# Patient Record
Sex: Male | Born: 1990 | Race: Black or African American | Hispanic: No | Marital: Single | Smoking: Current every day smoker
Health system: Southern US, Community
[De-identification: ages and names within clinical notes are randomized; demographics above are authoritative.]

## PROBLEM LIST (undated history)

## (undated) DIAGNOSIS — S91302A Unspecified open wound, left foot, initial encounter: Secondary | ICD-10-CM

---
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
## 2017-01-08 ENCOUNTER — Emergency Department (HOSPITAL_COMMUNITY): Payer: Worker's Compensation

## 2017-01-08 ENCOUNTER — Encounter (HOSPITAL_COMMUNITY): Payer: Self-pay | Admitting: *Deleted

## 2017-01-08 ENCOUNTER — Emergency Department (HOSPITAL_COMMUNITY): Payer: Worker's Compensation | Admitting: Anesthesiology

## 2017-01-08 ENCOUNTER — Inpatient Hospital Stay (HOSPITAL_COMMUNITY): Payer: Worker's Compensation

## 2017-01-08 ENCOUNTER — Inpatient Hospital Stay (HOSPITAL_COMMUNITY)
Admission: EM | Admit: 2017-01-08 | Discharge: 2017-01-11 | DRG: 908 | Disposition: A | Discharge status: Home Health | Payer: Worker's Compensation | Attending: Orthopedic Surgery | Admitting: Orthopedic Surgery

## 2017-01-08 ENCOUNTER — Encounter (HOSPITAL_COMMUNITY)
Admission: EM | Disposition: A | Discharge status: Home Health | Payer: Self-pay | Source: Home / Self Care | Attending: Orthopedic Surgery

## 2017-01-08 DIAGNOSIS — S92902B Unspecified fracture of left foot, initial encounter for open fracture: Secondary | ICD-10-CM

## 2017-01-08 DIAGNOSIS — S8262XA Displaced fracture of lateral malleolus of left fibula, initial encounter for closed fracture: Secondary | ICD-10-CM | POA: Diagnosis present

## 2017-01-08 DIAGNOSIS — S9782XA Crushing injury of left foot, initial encounter: Principal | ICD-10-CM | POA: Diagnosis present

## 2017-01-08 DIAGNOSIS — M79672 Pain in left foot: Secondary | ICD-10-CM | POA: Diagnosis present

## 2017-01-08 DIAGNOSIS — S92352B Displaced fracture of fifth metatarsal bone, left foot, initial encounter for open fracture: Secondary | ICD-10-CM | POA: Diagnosis present

## 2017-01-08 DIAGNOSIS — Y990 Civilian activity done for income or pay: Secondary | ICD-10-CM

## 2017-01-08 DIAGNOSIS — F17210 Nicotine dependence, cigarettes, uncomplicated: Secondary | ICD-10-CM | POA: Diagnosis present

## 2017-01-08 DIAGNOSIS — S92215B Nondisplaced fracture of cuboid bone of left foot, initial encounter for open fracture: Secondary | ICD-10-CM

## 2017-01-08 DIAGNOSIS — S92342B Displaced fracture of fourth metatarsal bone, left foot, initial encounter for open fracture: Secondary | ICD-10-CM | POA: Diagnosis present

## 2017-01-08 DIAGNOSIS — W230XXA Caught, crushed, jammed, or pinched between moving objects, initial encounter: Secondary | ICD-10-CM | POA: Diagnosis present

## 2017-01-08 DIAGNOSIS — S92332B Displaced fracture of third metatarsal bone, left foot, initial encounter for open fracture: Secondary | ICD-10-CM | POA: Diagnosis present

## 2017-01-08 DIAGNOSIS — S92322B Displaced fracture of second metatarsal bone, left foot, initial encounter for open fracture: Secondary | ICD-10-CM | POA: Diagnosis present

## 2017-01-08 DIAGNOSIS — T148XXA Other injury of unspecified body region, initial encounter: Secondary | ICD-10-CM

## 2017-01-08 HISTORY — PX: I & D EXTREMITY: SHX5045

## 2017-01-08 LAB — BASIC METABOLIC PANEL
ANION GAP: 6 (ref 5–15)
BUN: 16 mg/dL (ref 6–20)
CALCIUM: 8.7 mg/dL — AB (ref 8.9–10.3)
CO2: 25 mmol/L (ref 22–32)
Chloride: 110 mmol/L (ref 101–111)
Creatinine, Ser: 0.88 mg/dL (ref 0.61–1.24)
GFR calc Af Amer: >60 mL/min (ref >60)
GLUCOSE: 110 mg/dL — AB (ref 65–99)
Potassium: 4 mmol/L (ref 3.5–5.1)
SODIUM: 141 mmol/L (ref 135–145)

## 2017-01-08 LAB — CBC WITH DIFFERENTIAL/PLATELET
BASOS ABS: 0 10*3/uL (ref 0.0–0.1)
BASOS PCT: 0 %
Eosinophils Absolute: 0.1 10*3/uL (ref 0.0–0.7)
Eosinophils Relative: 1 %
HCT: 37 % — ABNORMAL LOW (ref 39.0–52.0)
Hemoglobin: 12.4 g/dL — ABNORMAL LOW (ref 13.0–17.0)
Lymphocytes Relative: 16 %
Lymphs Abs: 1.8 10*3/uL (ref 0.7–4.0)
MCH: 33.8 pg (ref 26.0–34.0)
MCHC: 33.5 g/dL (ref 30.0–36.0)
MCV: 100.8 fL — ABNORMAL HIGH (ref 78.0–100.0)
MONO ABS: 0.7 10*3/uL (ref 0.1–1.0)
Monocytes Relative: 6 %
NEUTROS ABS: 9.3 10*3/uL — AB (ref 1.7–7.7)
Neutrophils Relative %: 77 %
PLATELETS: 194 10*3/uL (ref 150–400)
RBC: 3.67 MIL/uL — ABNORMAL LOW (ref 4.22–5.81)
RDW: 12.4 % (ref 11.5–15.5)
WBC: 11.9 10*3/uL — ABNORMAL HIGH (ref 4.0–10.5)

## 2017-01-08 SURGERY — IRRIGATION AND DEBRIDEMENT EXTREMITY
Anesthesia: General | Site: Foot | Laterality: Left

## 2017-01-08 MED ORDER — SUGAMMADEX SODIUM 200 MG/2ML IV SOLN
INTRAVENOUS | Status: DC | PRN
  Administered 2017-01-08: 100 mg via INTRAVENOUS

## 2017-01-08 MED ORDER — ONDANSETRON HCL 4 MG/2ML IJ SOLN
INTRAMUSCULAR | Status: AC
  Filled 2017-01-08: qty 2

## 2017-01-08 MED ORDER — MORPHINE SULFATE (PF) 4 MG/ML IV SOLN
4.0000 mg | Freq: Once | INTRAVENOUS | Status: AC
  Administered 2017-01-08: 4 mg via INTRAVENOUS
  Filled 2017-01-08: qty 1

## 2017-01-08 MED ORDER — LACTATED RINGERS IV SOLN
INTRAVENOUS | Status: DC | PRN
  Administered 2017-01-08 (×2): via INTRAVENOUS

## 2017-01-08 MED ORDER — CEFAZOLIN SODIUM-DEXTROSE 2-4 GM/100ML-% IV SOLN
INTRAVENOUS | Status: AC
  Filled 2017-01-08: qty 100

## 2017-01-08 MED ORDER — MORPHINE SULFATE (PF) 2 MG/ML IV SOLN
2.0000 mg | INTRAVENOUS | Status: DC | PRN
  Administered 2017-01-09 – 2017-01-10 (×3): 2 mg via INTRAVENOUS
  Filled 2017-01-08 (×3): qty 1

## 2017-01-08 MED ORDER — PROPOFOL 10 MG/ML IV BOLUS
INTRAVENOUS | Status: AC
  Filled 2017-01-08: qty 40

## 2017-01-08 MED ORDER — LIDOCAINE 2% (20 MG/ML) 5 ML SYRINGE
INTRAMUSCULAR | Status: AC
  Filled 2017-01-08: qty 5

## 2017-01-08 MED ORDER — KETOROLAC TROMETHAMINE 15 MG/ML IJ SOLN
INTRAMUSCULAR | Status: AC
  Filled 2017-01-08: qty 1

## 2017-01-08 MED ORDER — LIDOCAINE HCL (CARDIAC) 20 MG/ML IV SOLN
INTRAVENOUS | Status: DC | PRN
  Administered 2017-01-08: 50 mg via INTRAVENOUS

## 2017-01-08 MED ORDER — FENTANYL CITRATE (PF) 100 MCG/2ML IJ SOLN
INTRAMUSCULAR | Status: DC | PRN
  Administered 2017-01-08 (×4): 50 ug via INTRAVENOUS

## 2017-01-08 MED ORDER — CEFAZOLIN SODIUM-DEXTROSE 1-4 GM/50ML-% IV SOLN
1.0000 g | Freq: Four times a day (QID) | INTRAVENOUS | Status: AC
  Administered 2017-01-08 – 2017-01-09 (×3): 1 g via INTRAVENOUS
  Filled 2017-01-08 (×3): qty 50

## 2017-01-08 MED ORDER — METHOCARBAMOL 1000 MG/10ML IJ SOLN
500.0000 mg | Freq: Four times a day (QID) | INTRAMUSCULAR | Status: DC | PRN
  Administered 2017-01-08: 500 mg via INTRAVENOUS
  Filled 2017-01-08: qty 550

## 2017-01-08 MED ORDER — ONDANSETRON HCL 4 MG/2ML IJ SOLN
INTRAMUSCULAR | Status: DC | PRN
  Administered 2017-01-08: 4 mg via INTRAVENOUS

## 2017-01-08 MED ORDER — METOCLOPRAMIDE HCL 5 MG/ML IJ SOLN: 5.0000 mg | Freq: Three times a day (TID) | INTRAMUSCULAR | Status: DC | PRN

## 2017-01-08 MED ORDER — MIDAZOLAM HCL 2 MG/2ML IJ SOLN
INTRAMUSCULAR | Status: AC
  Filled 2017-01-08: qty 2

## 2017-01-08 MED ORDER — METOCLOPRAMIDE HCL 5 MG PO TABS: 5.0000 mg | ORAL_TABLET | Freq: Three times a day (TID) | ORAL | Status: DC | PRN

## 2017-01-08 MED ORDER — HYDROMORPHONE HCL 1 MG/ML IJ SOLN: 0.2500 mg | INTRAMUSCULAR | Status: DC | PRN

## 2017-01-08 MED ORDER — SUCCINYLCHOLINE CHLORIDE 200 MG/10ML IV SOSY
PREFILLED_SYRINGE | INTRAVENOUS | Status: AC
  Filled 2017-01-08: qty 10

## 2017-01-08 MED ORDER — CEFAZOLIN SODIUM-DEXTROSE 2-3 GM-% IV SOLR
INTRAVENOUS | Status: DC | PRN
  Administered 2017-01-08: 2 g via INTRAVENOUS

## 2017-01-08 MED ORDER — SUGAMMADEX SODIUM 200 MG/2ML IV SOLN
INTRAVENOUS | Status: AC
  Filled 2017-01-08: qty 2

## 2017-01-08 MED ORDER — PROPOFOL 10 MG/ML IV BOLUS
INTRAVENOUS | Status: DC | PRN
  Administered 2017-01-08: 170 mg via INTRAVENOUS

## 2017-01-08 MED ORDER — FLEET ENEMA 7-19 GM/118ML RE ENEM: 1.0000 | ENEMA | Freq: Once | RECTAL | Status: DC | PRN

## 2017-01-08 MED ORDER — LACTATED RINGERS IV SOLN
INTRAVENOUS | Status: DC
  Administered 2017-01-08 – 2017-01-09 (×3): via INTRAVENOUS

## 2017-01-08 MED ORDER — ONDANSETRON HCL 4 MG PO TABS: 4.0000 mg | ORAL_TABLET | Freq: Four times a day (QID) | ORAL | Status: DC | PRN

## 2017-01-08 MED ORDER — CEFAZOLIN SODIUM-DEXTROSE 1-4 GM/50ML-% IV SOLN
1.0000 g | Freq: Once | INTRAVENOUS | Status: AC
  Administered 2017-01-08: 1 g via INTRAVENOUS
  Filled 2017-01-08: qty 50

## 2017-01-08 MED ORDER — DEXAMETHASONE SODIUM PHOSPHATE 10 MG/ML IJ SOLN
INTRAMUSCULAR | Status: DC | PRN
  Administered 2017-01-08: 4 mg via INTRAVENOUS

## 2017-01-08 MED ORDER — TETANUS-DIPHTH-ACELL PERTUSSIS 5-2.5-18.5 LF-MCG/0.5 IM SUSP
0.5000 mL | Freq: Once | INTRAMUSCULAR | Status: AC
  Administered 2017-01-08: 0.5 mL via INTRAMUSCULAR
  Filled 2017-01-08: qty 0.5

## 2017-01-08 MED ORDER — FENTANYL CITRATE (PF) 250 MCG/5ML IJ SOLN
INTRAMUSCULAR | Status: AC
  Filled 2017-01-08: qty 5

## 2017-01-08 MED ORDER — MIDAZOLAM HCL 5 MG/5ML IJ SOLN
INTRAMUSCULAR | Status: DC | PRN
  Administered 2017-01-08: 1 mg via INTRAVENOUS

## 2017-01-08 MED ORDER — ONDANSETRON HCL 4 MG/2ML IJ SOLN: 4.0000 mg | Freq: Four times a day (QID) | INTRAMUSCULAR | Status: DC | PRN

## 2017-01-08 MED ORDER — CEFAZOLIN SODIUM-DEXTROSE 2-4 GM/100ML-% IV SOLN: 2.0000 g | Freq: Once | INTRAVENOUS | Status: DC

## 2017-01-08 MED ORDER — ACETAMINOPHEN 650 MG RE SUPP: 650.0000 mg | Freq: Four times a day (QID) | RECTAL | Status: DC | PRN

## 2017-01-08 MED ORDER — METHOCARBAMOL 500 MG PO TABS
500.0000 mg | ORAL_TABLET | Freq: Four times a day (QID) | ORAL | Status: DC | PRN
  Administered 2017-01-10: 500 mg via ORAL
  Filled 2017-01-08: qty 1

## 2017-01-08 MED ORDER — SODIUM CHLORIDE 0.9 % IR SOLN
Status: DC | PRN
  Administered 2017-01-08: 6000 mL

## 2017-01-08 MED ORDER — KETOROLAC TROMETHAMINE 15 MG/ML IJ SOLN
15.0000 mg | Freq: Four times a day (QID) | INTRAMUSCULAR | Status: AC
  Administered 2017-01-08 – 2017-01-09 (×4): 15 mg via INTRAVENOUS
  Filled 2017-01-08 (×3): qty 1

## 2017-01-08 MED ORDER — DEXAMETHASONE SODIUM PHOSPHATE 10 MG/ML IJ SOLN
INTRAMUSCULAR | Status: AC
  Filled 2017-01-08: qty 1

## 2017-01-08 MED ORDER — ROCURONIUM BROMIDE 100 MG/10ML IV SOLN
INTRAVENOUS | Status: DC | PRN
  Administered 2017-01-08: 20 mg via INTRAVENOUS

## 2017-01-08 MED ORDER — SUCCINYLCHOLINE CHLORIDE 20 MG/ML IJ SOLN
INTRAMUSCULAR | Status: DC | PRN
  Administered 2017-01-08: 100 mg via INTRAVENOUS

## 2017-01-08 MED ORDER — PROMETHAZINE HCL 25 MG/ML IJ SOLN: 6.2500 mg | INTRAMUSCULAR | Status: DC | PRN

## 2017-01-08 MED ORDER — MAGNESIUM HYDROXIDE 400 MG/5ML PO SUSP: 30.0000 mL | Freq: Every day | ORAL | Status: DC | PRN

## 2017-01-08 MED ORDER — ROCURONIUM BROMIDE 50 MG/5ML IV SOSY
PREFILLED_SYRINGE | INTRAVENOUS | Status: AC
  Filled 2017-01-08: qty 5

## 2017-01-08 MED ORDER — OXYCODONE HCL 5 MG PO TABS
5.0000 mg | ORAL_TABLET | ORAL | Status: DC | PRN
  Administered 2017-01-09: 10 mg via ORAL
  Administered 2017-01-10 (×3): 5 mg via ORAL
  Administered 2017-01-11 (×2): 10 mg via ORAL
  Filled 2017-01-08 (×2): qty 2
  Filled 2017-01-08 (×2): qty 1
  Filled 2017-01-08: qty 2
  Filled 2017-01-08 (×2): qty 1

## 2017-01-08 MED ORDER — ACETAMINOPHEN 325 MG PO TABS
650.0000 mg | ORAL_TABLET | Freq: Four times a day (QID) | ORAL | Status: DC | PRN
  Administered 2017-01-10: 650 mg via ORAL
  Filled 2017-01-08: qty 2

## 2017-01-08 SURGICAL SUPPLY — 43 items
BAG SPEC THK2 15X12 ZIP CLS (MISCELLANEOUS)
BAG ZIPLOCK 12X15 (MISCELLANEOUS) ×1 IMPLANT
BANDAGE ESMARK 6X9 LF (GAUZE/BANDAGES/DRESSINGS) ×1 IMPLANT
BNDG CMPR 9X6 STRL LF SNTH (GAUZE/BANDAGES/DRESSINGS)
BNDG ESMARK 6X9 LF (GAUZE/BANDAGES/DRESSINGS)
BNDG GAUZE ELAST 4 BULKY (GAUZE/BANDAGES/DRESSINGS) ×1 IMPLANT
COVER SURGICAL LIGHT HANDLE (MISCELLANEOUS) ×3 IMPLANT
CUFF TOURN SGL QUICK 18 (TOURNIQUET CUFF) IMPLANT
CUFF TOURN SGL QUICK 24 (TOURNIQUET CUFF)
CUFF TOURN SGL QUICK 34 (TOURNIQUET CUFF)
CUFF TRNQT CYL 24X4X40X1 (TOURNIQUET CUFF) IMPLANT
CUFF TRNQT CYL 34X4X40X1 (TOURNIQUET CUFF) IMPLANT
DRAIN PENROSE 18X1/2 LTX STRL (DRAIN) ×1 IMPLANT
DRAPE INCISE IOBAN 66X45 STRL (DRAPES) ×2 IMPLANT
DRAPE OEC MINIVIEW 54X84 (DRAPES) ×2 IMPLANT
DRSG PAD ABDOMINAL 8X10 ST (GAUZE/BANDAGES/DRESSINGS) ×4 IMPLANT
DRSG VAC ATS MED SENSATRAC (GAUZE/BANDAGES/DRESSINGS) ×2 IMPLANT
DRSG VAC ATS SM SENSATRAC (GAUZE/BANDAGES/DRESSINGS) ×2 IMPLANT
DURAPREP 26ML APPLICATOR (WOUND CARE) ×1 IMPLANT
ELECT REM PT RETURN 15FT ADLT (MISCELLANEOUS) ×3 IMPLANT
GAUZE SPONGE 4X4 12PLY STRL (GAUZE/BANDAGES/DRESSINGS) ×3 IMPLANT
GAUZE XEROFORM 5X9 LF (GAUZE/BANDAGES/DRESSINGS) ×2 IMPLANT
GLOVE BIO SURGEON STRL SZ7 (GLOVE) ×1 IMPLANT
GLOVE ORTHO TXT STRL SZ7.5 (GLOVE) ×1 IMPLANT
GLOVE SURG ORTHO 8.5 STRL (GLOVE) ×2 IMPLANT
GLOVE SURG SS PI 8.0 STRL IVOR (GLOVE) ×2 IMPLANT
GOWN STRL REUS W/TWL XL LVL3 (GOWN DISPOSABLE) ×1 IMPLANT
HANDPIECE INTERPULSE COAX TIP (DISPOSABLE) ×3
KIT BASIN OR (CUSTOM PROCEDURE TRAY) ×3 IMPLANT
MANIFOLD NEPTUNE II (INSTRUMENTS) ×3 IMPLANT
PACK ORTHO EXTREMITY (CUSTOM PROCEDURE TRAY) ×3 IMPLANT
PAD CAST 4YDX4 CTTN HI CHSV (CAST SUPPLIES) ×1 IMPLANT
PADDING CAST COTTON 4X4 STRL (CAST SUPPLIES)
POSITIONER SURGICAL ARM (MISCELLANEOUS) ×3 IMPLANT
SET HNDPC FAN SPRY TIP SCT (DISPOSABLE) ×1 IMPLANT
SPLINT FIBERGLASS 3X35 (CAST SUPPLIES) ×2 IMPLANT
SPLINT FIBERGLASS 4X15 (CAST SUPPLIES) ×2 IMPLANT
SUT BONE WAX W31G (SUTURE) ×1 IMPLANT
SWAB COLLECTION DEVICE MRSA (MISCELLANEOUS) ×1 IMPLANT
SWAB CULTURE ESWAB REG 1ML (MISCELLANEOUS) ×1 IMPLANT
SYR CONTROL 10ML LL (SYRINGE) ×1 IMPLANT
TOWEL OR 17X26 10 PK STRL BLUE (TOWEL DISPOSABLE) ×6 IMPLANT
TRAY PREP A LATEX SAFE STRL (SET/KITS/TRAYS/PACK) ×2 IMPLANT

## 2017-01-08 NOTE — ED Notes (Signed)
ED Provider at bedside. 

## 2017-01-08 NOTE — H&P (Signed)
No primary care provider on file. Chief Complaint: Left foot crush injury at work. History: 26 year old otherwise he105althy gentleman who was at work. Unfortunately had his left foot run over by a forklift. He noted immediate pain and inability to ambulate and significant lacerations. Brought to the emergency room for further workup and treatment. No other significant medical issues. Otherwise healthy prior to this trauma.   No Known Allergies  No current facility-administered medications on file prior to encounter.    No current outpatient prescriptions on file prior to encounter.    Physical Exam: Vitals:   01/08/17 1032 01/08/17 1033  BP: (!) 154/94   Pulse: 73   Resp: 18   Temp:  97.7 F (36.5 C)  Alert and oriented 3.  No shortness of breath, chest pain. Heart regular rate and rhythm.  Abdomen soft and nontender. No rebound tenderness.  Upper extremity full range of motion, no gross formerly, or pain with range of motion of the shoulder, elbow, wrist.  Lower extremity. Left foot. 3 lacerations over the lateral and anterior lateral surface of the foot. Moderate bleeding (venous) is noted. No gross contamination is noted. There is obvious deformity over the dorsum of the foot and significant swelling in the mid and forefoot region.  Compartments of the calf are soft and nontender no pain with palpation. No hip or knee pain with palpation and range of motion.  Sensation in the foot to light touch is intact. He has pain but he is able to move the toes. Capillary refill is less than 2 seconds. Foot is warm to touch.  Image: Dg Ankle Complete Left  Result Date: 01/08/2017 CLINICAL DATA:  Multiple severe rib fractures.  Crush injury. EXAM: LEFT ANKLE COMPLETE - 3+ VIEW COMPARISON:  None. FINDINGS: Horizontal fracture through the lateral malleolus. The ankle mortise intact. Talar dome appears normal. Multiple severe tarsal and metatarsal fractures described on foot film. IMPRESSION:  1. Horizontal fracture of the lateral malleolus. 2. Ankle mortise intact. 3. Severe comminuted fractures of the tarsal row and proximal metatarsal row. Recommend CT ankle and foot. Electronically Signed   By: Genevive BiStewart  Edmunds M.D.   On: 01/08/2017 11:26   Dg Foot Complete Left  Result Date: 01/08/2017 CLINICAL DATA:  Pt c/o pain to obv deformity, open wounds to foot/ankle s/p crush inj today, extremity ran over by a forklift at his job. EXAM: LEFT FOOT - COMPLETE 3+ VIEW COMPARISON:  None FINDINGS: There is fractures of the proximal aspect of the second third fourth and fifth metatarsals. Severe comminution of the base of the fifth metatarsal. The fifth metatarsal is dislocated at the metatarsal-phalangeal joint. The metatarsal tarsal joint is poorly evaluated and likely disrupted. On lateral projection the cuboid bone is fractured. Calcaneus appears normal. IMPRESSION: 1. Comminuted fractures of the base of the second, third, fourth and fifth metatarsals. 2. Fifth metatarsal base severely comminuted. Fifth metatarsal phalangeal joint dislocation. 3. Fracture of the cuboid bone. 4. Disruption of the tarsal metatarsal joints. Concern for additional tarsal fractures. Recommend CT of the foot. Electronically Signed   By: Genevive BiStewart  Edmunds M.D.   On: 01/08/2017 11:24    A/P: This is a pleasant 26 year old gentleman who was unfortunately injured while at work this morning. Patient suffered a crush injury to the left foot with multiple fractures. In addition there is moderate swelling of the left foot.  Plan.  Formal I&D of the open fracture in the operating room.  Release of foot compartments.  Application of VAC dressing  and posterior splint.  We'll plan on admission to the hospital postoperative for IV antibiotics and elevation and observation. We will review x-rays with my partner Dr. Victorino Dike and defer definitive management to him. Discussed this with the patient, and his mother and all of their  questions were addressed. Reviewed all risks and benefits of surgery which include infection, death, stroke, paralysis, non-healing of the soft tissue, and bone. Need for additional surgery. Ongoing or worse pain. Permanent or partial disability, or impairment.

## 2017-01-08 NOTE — Transfer of Care (Signed)
Immediate Anesthesia Transfer of Care Note  Patient: John Kelley  Procedure(s) Performed: Procedure(s): IRRIGATION AND DEBRIDEMENT , closed reduction pinning of open foot fracture, application of wound vac. (Left)  Patient Location: PACU  Anesthesia Type:General  Level of Consciousness: awake, alert  and oriented  Airway & Oxygen Therapy: Patient Spontanous Breathing and Patient connected to face mask oxygen  Post-op Assessment: Report given to RN and Post -op Vital signs reviewed and stable  Post vital signs: Reviewed and stable  Last Vitals:  Vitals:   01/08/17 1033 01/08/17 1445  BP:  (!) 167/84  Pulse:    Resp:  16  Temp: 36.5 C 36.6 C    Last Pain:  Vitals:   01/08/17 1034  TempSrc:   PainSc: 8          Complications: No apparent anesthesia complications

## 2017-01-08 NOTE — Op Note (Signed)
Operative report.  Preoperative diagnosis. Open crush injury to the left foot. Metatarsal fractures 2, 3, 4, 5. Dislocation fifth metatarsal. Comminuted cuboid fracture. 2 large lacerations anterior lateral aspect of the heel. Puncture wound over the mid foot fifth metatarsal.  Postoperative diagnosis same  Operative procedure. 1. Irrigation and debridement of open left foot injury.  2. Closed reduction percutaneous pinning of multiple foot fractures.  3 application of wound VAC left foot.  4. Application of posterior splint   Complications none.  Indications. 26 year old male who unfortunately suffered a work-related injury this morning. Patient's left foot was crushed under a forklift resulting in multiple foot injuries. Brought to the emergency room for treatment. After discussing treatment options, we elected to proceed with the aforementioned procedure. All appropriate risks benefits and alternatives to surgery were discussed with the patient and his mother. Consent was obtained.  Operative report. Patient was brought to the operating room placed on the operating table. After successful induction of general anesthesia, and endotracheal intubation the left lower extremity was prepped and draped in a standard fashion. Timeout was taken to confirm the patient, procedure, and all other important pertinent data.  Using pulse lavage the lateral lacerations were I indeed. Using pulsatile lavage and sharp dissection I removed any grossly contaminated material and irrigated copiously with 3 L of normal saline. Once this was completed I then made 2 incisions between the first and second third and fourth metatarsals. I dissected down to the interosseous membrane bluntly and then dissected into this to release the compartments. With the compartments released I then palpated the displaced fifth metatarsal and reduced it. At this point with the foot reduced I then placed a K wire starting medially going  across and fixing the first second third and fourth metatarsals together I then placed a second pin on the lateral aspect of the fifth metatarsal and held temporarily reduced. A third K wire where prior was placed across the cuboid in order to maintain stability here. Intraoperative x-rays demonstrated the foot was in near anatomical position in the AP oblique and lateral views. At this point I was satisfied with the I&D in the temporary stabilization. In addition I had decompressed the compartments to prevent any compartment syndrome.  At this point in time a VAC was then secured to the patient as well as a posterior splint. The patient was then extubated transferred the PACU that incident. The end of the case all needle sponge counts were correct. There were no adverse intraoperative events.

## 2017-01-08 NOTE — ED Notes (Signed)
[facility] to [facility]

## 2017-01-08 NOTE — ED Notes (Signed)
Bed: [id number] Expected date:  Expected time:  Means of arrival:  Comments: 26 yo Foot run over by forklift

## 2017-01-08 NOTE — ED Triage Notes (Addendum)
Pt brought in by EMS for foot and ankle injury x 1 hr ago, ran over by forklift. PTA fent total bleeding noted to saturated drg

## 2017-01-08 NOTE — Anesthesia Postprocedure Evaluation (Signed)
Anesthesia Post Note  Patient: [place] L Corkins  Procedure(s) Performed: Procedure(s) (LRB): IRRIGATION AND DEBRIDEMENT , closed reduction pinning of open foot fracture, application of wound vac. (Left)     Patient location during evaluation: PACU Anesthesia Type: General Level of consciousness: awake and alert Pain management: pain level controlled Vital Signs Assessment: post-procedure vital signs reviewed and stable Respiratory status: spontaneous breathing, nonlabored ventilation, respiratory function stable and patient connected to nasal cannula oxygen Cardiovascular status: blood pressure returned to baseline and stable Postop Assessment: no signs of nausea or vomiting Anesthetic complications: no    Last Vitals:  Vitals:   01/08/17 1515 01/08/17 1545  BP: (!) 150/87   Pulse:    Resp:    Temp:  37 C    Last Pain:  Vitals:   01/08/17 1545  TempSrc:   PainSc: Asleep      LLE Sensation: Numbness (NUMBNESS CONT AT 5TH DIGIT LEFT FOOT) (01/08/17 1545)          Kennieth RadFitzgerald, Derrall Hicks E

## 2017-01-08 NOTE — Anesthesia Procedure Notes (Signed)
Procedure Name: Intubation Date/Time: 01/08/2017 1:09 PM Performed by: Sherian Maroon A Pre-anesthesia Checklist: Patient identified, Timeout performed, Patient being monitored, Suction available and Emergency Drugs available Patient Re-evaluated:Patient Re-evaluated prior to inductionOxygen Delivery Method: Circle system utilized Preoxygenation: Pre-oxygenation with 100% oxygen Intubation Type: IV induction Ventilation: Mask ventilation without difficulty Laryngoscope Size: Mac and 4 Grade View: Grade I Tube type: Oral Number of attempts: 1 Airway Equipment and Method: Stylet Placement Confirmation: ETT inserted through vocal cords under direct vision,  positive ETCO2 and breath sounds checked- equal and bilateral Secured at: 22 cm Tube secured with: Tape

## 2017-01-08 NOTE — ED Provider Notes (Signed)
[facility]-EMERGENCY DEPT Provider Note   CSN: [id number] Arrival date & time: 01/08/17  1023     History   Chief Complaint Chief Complaint  Patient presents with  . Foot Injury    HPI John Kelley is a 26 y.o. male.  John L Buehl is a 26 y.o. Male who presents to the emergency department with left foot and ankle injury while at work today. Patient reports he works at the [employer] center when a forklift backed over his left foot and ankle. He reports the forklift was resting on his foot and ankle for brief time until somebody was able to lift the forklift off of his foot and ankle. He reports pain to his left foot and ankle. EMS brought him to the emergency department. He's had 250 g of fentanyl prior to arrival. He denies any allergies. No anticoagulants. No daily medications. He denies other injuries. He does complain of some numbness to his left fifth toe.   The history is provided by the patient and medical records. No language interpreter was used.  Foot Injury      History reviewed. No pertinent past medical history.  There are no active problems to display for this patient.   History reviewed. No pertinent surgical history.     Home Medications    Prior to Admission medications   Not on File    Family History History reviewed. No pertinent family history.  Social History Social History  Substance Use Topics  . Smoking status: Current Every Day Smoker    Packs/day: 0.50    Types: Cigarettes  . Smokeless tobacco: Not on file  . Alcohol use No     Allergies   Patient has no known allergies.   Review of Systems Review of Systems  Constitutional: Negative for chills and fever.  HENT: Negative for congestion and sore throat.   Eyes: Negative for visual disturbance.  Respiratory: Negative for cough and shortness of breath.   Cardiovascular: Negative for chest pain.  Gastrointestinal: Negative for abdominal pain, nausea and  vomiting.  Genitourinary: Negative for dysuria.  Musculoskeletal: Positive for arthralgias. Negative for back pain and neck pain.  Skin: Positive for wound. Negative for rash.  Neurological: Negative for syncope, light-headedness and headaches.     Physical Exam Updated Vital Signs BP (!) 154/94   Pulse 73   Temp 97.7 F (36.5 C) (Axillary)   Resp 18   Ht 5\' 11"  (1.803 m)   Wt 61.2 kg (135 lb)   SpO2 100%   BMI 18.83 kg/m   Physical Exam  Constitutional: He is oriented to person, place, and time. He appears well-developed and well-nourished. No distress.  Nontoxic appearing.  HENT:  Head: Normocephalic and atraumatic.  Right Ear: External ear normal.  Left Ear: External ear normal.  Eyes: Conjunctivae are normal. Pupils are equal, round, and reactive to light. Right eye exhibits no discharge. Left eye exhibits no discharge.  Neck: Neck supple.  Cardiovascular: Normal rate, regular rhythm, normal heart sounds and intact distal pulses.   Bilateral dorsalis pedis and posterior tibialis pulses are intact.  Pulmonary/Chest: Effort normal and breath sounds normal. No respiratory distress.  Abdominal: Soft. There is no tenderness.  Musculoskeletal: He exhibits tenderness and deformity.  Deformity and several wounds noted to his left foot. Is a deformity noted to the dorsal aspect of his left foot. There are 2 large lacerations noted to the lateral aspect of his left foot. There is also a small puncture  wound noted to the dorsum of his left foot. He has good dorsalis pedis and posterior tibialis pulses. He reports decreased sensation to his left fifth toe. No left knee or hip tenderness to palpation.  Lymphadenopathy:    He has no cervical adenopathy.  Neurological: He is alert and oriented to person, place, and time. No sensory deficit. Coordination normal.  Sensation is intact in his bilateral toes except for his left fifth toe where he reports decreased sensation.  Skin: Skin is  warm and dry. Capillary refill takes less than 2 seconds. No rash noted. He is not diaphoretic. No erythema. No pallor.  Psychiatric: He has a normal mood and affect. His behavior is normal.  Nursing note and vitals reviewed.    ED Treatments / Results  Labs (all labs ordered are listed, but only abnormal results are displayed) Labs Reviewed  BASIC METABOLIC PANEL - Abnormal; Notable for the following:       Result Value   Glucose, Bld 110 (*)    Calcium 8.7 (*)    All other components within normal limits  CBC WITH DIFFERENTIAL/PLATELET - Abnormal; Notable for the following:    WBC 11.9 (*)    RBC 3.67 (*)    Hemoglobin 12.4 (*)    HCT 37.0 (*)    MCV 100.8 (*)    Neutro Abs 9.3 (*)    All other components within normal limits    EKG  EKG Interpretation None       Radiology Dg Ankle Complete Left  Result Date: 01/08/2017 CLINICAL DATA:  Multiple severe rib fractures.  Crush injury. EXAM: LEFT ANKLE COMPLETE - 3+ VIEW COMPARISON:  None. FINDINGS: Horizontal fracture through the lateral malleolus. The ankle mortise intact. Talar dome appears normal. Multiple severe tarsal and metatarsal fractures described on foot film. IMPRESSION: 1. Horizontal fracture of the lateral malleolus. 2. Ankle mortise intact. 3. Severe comminuted fractures of the tarsal row and proximal metatarsal row. Recommend CT ankle and foot. Electronically Signed   By: Genevive Bi M.D.   On: 01/08/2017 11:26   Ct Foot Left Wo Contrast  Result Date: 01/08/2017 CLINICAL DATA:  Crush injury of the left foot with multiple open fractures. The patient's foot was run over by a forklift. EXAM: CT OF THE LEFT FOOT WITHOUT CONTRAST TECHNIQUE: Multidetector CT imaging of the left foot was performed according to the standard protocol. Multiplanar CT image reconstructions were also generated. COMPARISON:  Radiographs dated 01/08/2017 FINDINGS: Bones/Joint/Cartilage Distal fibula: There is a transverse fracture through  the lateral malleolus without displacement. Talus: Small avulsions from the distal dorsal lateral aspect of the talus. Calcaneus: Small avulsion fractures lateral plantar aspect of the distal calcaneus at the calcaneocuboid joint. Navicular: There is a focal avulsion fracture of the medial plantar aspect of the navicular. Cuboid: There is a comminuted fracture of the lateral aspect of the cuboid with gas in the adjacent soft tissues as well as within the bone. Cuneiforms: There is disruption of the plantar ligaments at the cuneiforms. There small avulsions from the medial cuneiform with the severely comminuted fracture of the plantar aspect of middle and lateral cuneiforms. Extensive gas in the soft tissues. Metatarsals: Severely comminuted fracture of the proximal fifth metatarsal with spiral fracture extending throughout the shaft of the fifth metatarsal. There is dislocation at fifth MTP joint. There is a severely comminuted fracture of the base of second metatarsal. There are small avulsions from plantar aspect of the base of the third metatarsal. Fourth metatarsal appears  to be intact. First metatarsal is intact. Soft tissues There is extensive gas in the soft tissues of the foot particularly dorsally. There is gas in some of the bones including the cuboid, navicular, and talus There is an abnormal fluid collection at the dorsal aspect distal forefoot which probably represents a seroma, also containing air. IMPRESSION: Extensive fractures of left foot as described. Electronically Signed   By: Francene BoyersJames  Maxwell M.D.   On: 01/08/2017 12:56   Dg Foot Complete Left  Result Date: 01/08/2017 CLINICAL DATA:  Pt c/o pain to obv deformity, open wounds to foot/ankle s/p crush inj today, extremity ran over by a forklift at his job. EXAM: LEFT FOOT - COMPLETE 3+ VIEW COMPARISON:  None FINDINGS: There is fractures of the proximal aspect of the second third fourth and fifth metatarsals. Severe comminution of the base of the  fifth metatarsal. The fifth metatarsal is dislocated at the metatarsal-phalangeal joint. The metatarsal tarsal joint is poorly evaluated and likely disrupted. On lateral projection the cuboid bone is fractured. Calcaneus appears normal. IMPRESSION: 1. Comminuted fractures of the base of the second, third, fourth and fifth metatarsals. 2. Fifth metatarsal base severely comminuted. Fifth metatarsal phalangeal joint dislocation. 3. Fracture of the cuboid bone. 4. Disruption of the tarsal metatarsal joints. Concern for additional tarsal fractures. Recommend CT of the foot. Electronically Signed   By: Genevive BiStewart  Edmunds M.D.   On: 01/08/2017 11:24    Procedures Procedures (including critical care time)  Medications Ordered in ED Medications  ceFAZolin (ANCEF) 2-4 GM/100ML-% IVPB (not administered)  ceFAZolin (ANCEF) IVPB 2g/100 mL premix ( Intravenous MAR Hold 01/08/17 1301)  morphine 4 MG/ML injection 4 mg (4 mg Intravenous Given 01/08/17 1056)  ceFAZolin (ANCEF) IVPB 1 g/50 mL premix (0 g Intravenous Stopped 01/08/17 1120)  Tdap (BOOSTRIX) injection 0.5 mL (0.5 mLs Intramuscular Given 01/08/17 1119)  morphine 4 MG/ML injection 4 mg (4 mg Intravenous Given 01/08/17 1213)     Initial Impression / Assessment and Plan / ED Course  I have reviewed the triage vital signs and the nursing notes.  Pertinent labs & imaging results that were available during my care of the patient were reviewed by me and considered in my medical decision making (see chart for details).     This  is a 26 y.o. Male who presents to the emergency department with left foot and ankle injury while at work today. Patient reports he works at the [employer] center when a forklift backed over his left foot and ankle. He reports the forklift was resting on his foot and ankle for brief time until somebody was able to lift the forklift off of his foot and ankle. On exam patient has deformity and open wounds noted to his left  foot. His dorsalis pedis and posterior tibialis pulse are intact. Good capillary refill to his distal toes. He reports decreased sensation to his left fifth toe. There is oozing bleeding from the wounds. No arterial bleeding noted. Will initiate IV access, x-ray, update tetanus, and a gram of Ancef. X-rays of his left foot and ankle reveal comminuted fractures at the base of the second, third, fourth and fifth metatarsals. There is a fracture of the cuboid bone. There is a lateral malleolus fracture. I called and consulted with orthopedic surgeon Dr. Shon BatonBrooks. He would like a CT scan of the foot and he will be by to see the patient and plans to take him to the OR shortly. At reevaluation patient reports his pain  has improved, but he is still having significant pain. Will provide with additional morphine. He agrees with plan for CT scan. I advised the plan for Dr. Shon Baton to see him and likely plan for operating room. Patient's mother is at bedside.  The patient was taken to the OR by Dr. Shon Baton.   Final Clinical Impressions(s) / ED Diagnoses   Final diagnoses:  Foot fracture, left, open, initial encounter  Accident caused by forklift, initial encounter  Open nondisplaced fracture of cuboid of left foot, initial encounter    New Prescriptions There are no discharge medications for this patient.    Everlene Farrier, PA-C 01/08/17 1315    Donnetta Hutching, MD 01/09/17 [phone]

## 2017-01-08 NOTE — Anesthesia Preprocedure Evaluation (Signed)
Anesthesia Evaluation  Patient identified by MRN, date of birth, ID band Patient awake    Reviewed: Allergy & Precautions, NPO status , Patient's Chart, lab work & pertinent test results  Airway Mallampati: II  TM Distance: >3 FB Neck ROM: Full    Dental  (+) Dental Advisory Given   Pulmonary Current Smoker,    breath sounds clear to auscultation       Cardiovascular negative cardio ROS   Rhythm:Regular Rate:Normal     Neuro/Psych negative neurological ROS     GI/Hepatic negative GI ROS, Neg liver ROS,   Endo/Other  negative endocrine ROS  Renal/GU negative Renal ROS     Musculoskeletal Open left foot fractures.   Abdominal   Peds  Hematology negative hematology ROS (+)   Anesthesia Other Findings   Reproductive/Obstetrics                             Lab Results  Component Value Date   WBC 11.9 (H) 01/08/2017   HGB 12.4 (L) 01/08/2017   HCT 37.0 (L) 01/08/2017   MCV 100.8 (H) 01/08/2017   PLT 194 01/08/2017   Lab Results  Component Value Date   CREATININE 0.88 01/08/2017   BUN 16 01/08/2017   NA 141 01/08/2017   K 4.0 01/08/2017   CL 110 01/08/2017   CO2 25 01/08/2017    Anesthesia Physical Anesthesia Plan  ASA: II and emergent  Anesthesia Plan: General   Post-op Pain Management:    Induction: Intravenous and Rapid sequence  PONV Risk Score and Plan: 2 and Ondansetron and Dexamethasone  Airway Management Planned: Oral ETT  Additional Equipment:   Intra-op Plan:   Post-operative Plan: Extubation in OR  Informed Consent: I have reviewed the patients History and Physical, chart, labs and discussed the procedure including the risks, benefits and alternatives for the proposed anesthesia with the patient or authorized representative who has indicated his/her understanding and acceptance.   Dental advisory given  Plan Discussed with: CRNA  Anesthesia Plan  Comments:         Anesthesia Quick Evaluation

## 2017-01-09 ENCOUNTER — Encounter (HOSPITAL_COMMUNITY): Payer: Self-pay | Admitting: Orthopedic Surgery

## 2017-01-09 MED ORDER — CEPHALEXIN 500 MG PO CAPS: 500.0000 mg | ORAL_CAPSULE | Freq: Four times a day (QID) | ORAL | 1 refills | Status: DC

## 2017-01-09 MED ORDER — RISAQUAD PO CAPS
1.0000 | ORAL_CAPSULE | Freq: Every day | ORAL | Status: DC
  Administered 2017-01-09 – 2017-01-11 (×3): 1 via ORAL
  Filled 2017-01-09 (×3): qty 1

## 2017-01-09 MED ORDER — CEFAZOLIN SODIUM-DEXTROSE 1-4 GM/50ML-% IV SOLN
1.0000 g | Freq: Three times a day (TID) | INTRAVENOUS | Status: DC
  Administered 2017-01-09 – 2017-01-10 (×4): 1 g via INTRAVENOUS
  Filled 2017-01-09 (×6): qty 50

## 2017-01-09 MED ORDER — OXYCODONE-ACETAMINOPHEN 5-325 MG PO TABS: 1.0000 | ORAL_TABLET | ORAL | 0 refills | Status: DC | PRN

## 2017-01-09 MED ORDER — ASPIRIN 325 MG PO TABS
325.0000 mg | ORAL_TABLET | Freq: Every day | ORAL | Status: DC
  Administered 2017-01-09 – 2017-01-11 (×3): 325 mg via ORAL
  Filled 2017-01-09 (×3): qty 1

## 2017-01-09 MED ORDER — DOCUSATE SODIUM 100 MG PO CAPS: 100.0000 mg | ORAL_CAPSULE | Freq: Two times a day (BID) | ORAL | 1 refills | Status: DC | PRN

## 2017-01-09 MED ORDER — METHOCARBAMOL 500 MG PO TABS: 500.0000 mg | ORAL_TABLET | Freq: Four times a day (QID) | ORAL | 1 refills | Status: DC | PRN

## 2017-01-09 MED ORDER — ASPIRIN EC 325 MG PO TBEC: 325.0000 mg | DELAYED_RELEASE_TABLET | Freq: Every day | ORAL | 1 refills | Status: DC

## 2017-01-09 NOTE — Progress Notes (Signed)
Subjective: 1 Day Post-Op Procedure(s) (LRB): IRRIGATION AND DEBRIDEMENT , closed reduction pinning of open foot fracture, application of wound vac. (Left) Patient reports pain as 3 on 0-10 scale.    Objective: Vital signs in last 24 hours: Temp:  [97.7 F (36.5 C)-99.2 F (37.3 C)] 98.5 F (36.9 C) (06/24 0520) Pulse Rate:  [60-78] 72 (06/24 0520) Resp:  [12-18] 18 (06/24 0520) BP: (119-167)/(56-94) 126/63 (06/24 0520) SpO2:  [98 %-100 %] 99 % (06/24 0520) Weight:  [61.2 kg (135 lb)] 61.2 kg (135 lb) (06/23 1031)  Intake/Output from previous day: 06/23 0701 - 06/24 0700 In: 3356.6 [P.O.:830; I.V.:2423.5; IV Piggyback:103.1] Out: 1745 [Urine:450; Drains:600; Stool:675; Blood:20] Intake/Output this shift: Total I/O In: -  Out: 700 [Urine:700]   Recent Labs  01/08/17 1042  HGB 12.4*    Recent Labs  01/08/17 1042  WBC 11.9*  RBC 3.67*  HCT 37.0*  PLT 194    Recent Labs  01/08/17 1042  NA 141  K 4.0  CL 110  CO2 25  BUN 16  CREATININE 0.88  GLUCOSE 110*  CALCIUM 8.7*   No results for input(s): LABPT, INR in the last 72 hours.  Neurologically intact Intact pulses distally Dorsiflexion/Plantar flexion intact Compartment soft Good capillary refill. VAC intact. Assessment/Plan: 1 Day Post-Op Procedure(s) (LRB): IRRIGATION AND DEBRIDEMENT , closed reduction pinning of open foot fracture, application of wound vac. (Left) Advance diet Plan for discharge tomorrow  Or today. Awaiting Plastics Consult to determine soft tissue plan  Fayrene Towner C 01/09/2017, 7:58 AM

## 2017-01-09 NOTE — Consult Note (Signed)
Reason for Consult: threatened soft tissue over fractures foot Referring Physician: D. Rolena Infante MD Location: [facility]- inpatient Date: 6.23.28  John Kelley is an 26 y.o. male.  HPI: Admitted 6.23.18 following work related injury with forklift dropping on left foot. Per Op Note significant soft tissue laceration over lateral foot and puncture injury heel noted. MT fractures 2-5 noted with comminution and dislocation 5th MT base noted. Additional fracture cuboid noted. Underwent PP MT fractures, PP cuboid, release comparments and I%D soft tissue. Plastic Surgery consulted for concern soft tissue viability. Per Dr. Rolena Infante, no exposure bone or tendon at end of procedure, no open wounds at end of procedure. Incisional VAC in place.  Patient lives with mother here in [place].  History reviewed. No pertinent past medical history.  Past Surgical History:  Procedure Laterality Date  . I&D EXTREMITY Left 01/08/2017   Procedure: IRRIGATION AND DEBRIDEMENT , closed reduction pinning of open foot fracture, application of wound vac.;  Surgeon: Melina Schools, MD;  Location: [facility];  Service: Orthopedics;  Laterality: Left;    History reviewed. No pertinent family history.  Social History:  reports that he has been smoking Cigarettes.  He has been smoking about 0.50 packs per day. He has never used smokeless tobacco. He reports that he does not drink alcohol or use drugs.  Allergies: No Known Allergies  Medications: I have reviewed the patient's current medications.  Results for orders placed or performed during the hospital encounter of 01/08/17 (from the past 48 hour(s))  Basic metabolic panel     Status: Abnormal   Collection Time: 01/08/17 10:42 AM  Result Value Ref Range   Sodium 141 135 - 145 mmol/L   Potassium 4.0 3.5 - 5.1 mmol/L   Chloride 110 101 - 111 mmol/L   CO2 25 22 - 32 mmol/L   Glucose, Bld 110 (H) 65 - 99 mg/dL   BUN 16 6 - 20 mg/dL   Creatinine, Ser 0.88 0.61 - 1.24  mg/dL   Calcium 8.7 (L) 8.9 - 10.3 mg/dL   GFR calc non Af Amer >60 >60 mL/min   GFR calc Af Amer >60 >60 mL/min    Comment: (NOTE) The eGFR has been calculated using the CKD EPI equation. This calculation has not been validated in all clinical situations. eGFR's persistently <60 mL/min signify possible Chronic Kidney Disease.    Anion gap 6 5 - 15  CBC with Differential     Status: Abnormal   Collection Time: 01/08/17 10:42 AM  Result Value Ref Range   WBC 11.9 (H) 4.0 - 10.5 K/uL   RBC 3.67 (L) 4.22 - 5.81 MIL/uL   Hemoglobin 12.4 (L) 13.0 - 17.0 g/dL   HCT 37.0 (L) 39.0 - 52.0 %   MCV 100.8 (H) 78.0 - 100.0 fL   MCH 33.8 26.0 - 34.0 pg   MCHC 33.5 30.0 - 36.0 g/dL   RDW 12.4 11.5 - 15.5 %   Platelets 194 150 - 400 K/uL   Neutrophils Relative % 77 %   Neutro Abs 9.3 (H) 1.7 - 7.7 K/uL   Lymphocytes Relative 16 %   Lymphs Abs 1.8 0.7 - 4.0 K/uL   Monocytes Relative 6 %   Monocytes Absolute 0.7 0.1 - 1.0 K/uL   Eosinophils Relative 1 %   Eosinophils Absolute 0.1 0.0 - 0.7 K/uL   Basophils Relative 0 %   Basophils Absolute 0.0 0.0 - 0.1 K/uL    Dg Ankle Complete Left  Result Date: 01/08/2017 CLINICAL  DATA:  Multiple severe rib fractures.  Crush injury. EXAM: LEFT ANKLE COMPLETE - 3+ VIEW COMPARISON:  None. FINDINGS: Horizontal fracture through the lateral malleolus. The ankle mortise intact. Talar dome appears normal. Multiple severe tarsal and metatarsal fractures described on foot film. IMPRESSION: 1. Horizontal fracture of the lateral malleolus. 2. Ankle mortise intact. 3. Severe comminuted fractures of the tarsal row and proximal metatarsal row. Recommend CT ankle and foot. Electronically Signed   By: Suzy Bouchard M.D.   On: 01/08/2017 11:26   Ct Foot Left Wo Contrast  Result Date: 01/08/2017 CLINICAL DATA:  Crush injury of the left foot with multiple open fractures. The patient's foot was run over by a forklift. EXAM: CT OF THE LEFT FOOT WITHOUT CONTRAST TECHNIQUE:  Multidetector CT imaging of the left foot was performed according to the standard protocol. Multiplanar CT image reconstructions were also generated. COMPARISON:  Radiographs dated 01/08/2017 FINDINGS: Bones/Joint/Cartilage Distal fibula: There is a transverse fracture through the lateral malleolus without displacement. Talus: Small avulsions from the distal dorsal lateral aspect of the talus. Calcaneus: Small avulsion fractures lateral plantar aspect of the distal calcaneus at the calcaneocuboid joint. Navicular: There is a focal avulsion fracture of the medial plantar aspect of the navicular. Cuboid: There is a comminuted fracture of the lateral aspect of the cuboid with gas in the adjacent soft tissues as well as within the bone. Cuneiforms: There is disruption of the plantar ligaments at the cuneiforms. There small avulsions from the medial cuneiform with the severely comminuted fracture of the plantar aspect of middle and lateral cuneiforms. Extensive gas in the soft tissues. Metatarsals: Severely comminuted fracture of the proximal fifth metatarsal with spiral fracture extending throughout the shaft of the fifth metatarsal. There is dislocation at fifth MTP joint. There is a severely comminuted fracture of the base of second metatarsal. There are small avulsions from plantar aspect of the base of the third metatarsal. Fourth metatarsal appears to be intact. First metatarsal is intact. Soft tissues There is extensive gas in the soft tissues of the foot particularly dorsally. There is gas in some of the bones including the cuboid, navicular, and talus There is an abnormal fluid collection at the dorsal aspect distal forefoot which probably represents a seroma, also containing air. IMPRESSION: Extensive fractures of left foot as described. Electronically Signed   By: Lorriane Shire M.D.   On: 01/08/2017 12:56   Dg Foot 2 Views Left  Result Date: 01/08/2017 CLINICAL DATA:  Status post percutaneous fixation of  the left foot after crush injury. EXAM: LEFT FOOT - 2 VIEW COMPARISON:  Left foot CT 01/08/2017 FINDINGS: There are 3 percutaneous fixation wires traversing the metatarsal bases and the tarsometatarsal articulation. There is no visible hardware abnormality. Alignment is greatly improved and anatomic. IMPRESSION: Anatomic alignment of the left foot following percutaneous fixation. Electronically Signed   By: Ulyses Jarred M.D.   On: 01/08/2017 17:01   Dg Foot Complete Left  Result Date: 01/08/2017 CLINICAL DATA:  Pt c/o pain to obv deformity, open wounds to foot/ankle s/p crush inj today, extremity ran over by a forklift at his job. EXAM: LEFT FOOT - COMPLETE 3+ VIEW COMPARISON:  None FINDINGS: There is fractures of the proximal aspect of the second third fourth and fifth metatarsals. Severe comminution of the base of the fifth metatarsal. The fifth metatarsal is dislocated at the metatarsal-phalangeal joint. The metatarsal tarsal joint is poorly evaluated and likely disrupted. On lateral projection the cuboid bone is fractured. Calcaneus appears  normal. IMPRESSION: 1. Comminuted fractures of the base of the second, third, fourth and fifth metatarsals. 2. Fifth metatarsal base severely comminuted. Fifth metatarsal phalangeal joint dislocation. 3. Fracture of the cuboid bone. 4. Disruption of the tarsal metatarsal joints. Concern for additional tarsal fractures. Recommend CT of the foot. Electronically Signed   By: Suzy Bouchard M.D.   On: 01/08/2017 11:24    ROS Blood pressure 126/63, pulse 72, temperature 98.5 F (36.9 C), temperature source Oral, resp. rate 18, height '5\' 11"'  (1.803 m), weight 61.2 kg (135 lb), SpO2 99 %. Physical Exam Alert, NAD, notes numbness in area of injury Left foot with posterior splint and VAC in place; removed external Ace wrap and some dried drainage on guaze, VAC with Ioban dressing holding suction, two sponges in place one over dorsal distal foot and  Near anterior ankle,  some fluid beneath Ioban over medial heel noted  Assessment/Plan: Limited exam due to presence of Ioban and VAC. Counseled patient high risk of developing soft tissue necrosis given mechanism of injury and will need to follow for soft tissue declaration. Counseled if soft tissue dies this places his underlying fractures at risk of non healing or infection and may require additional surgery for soft tissue coverage.  Reviewed importance no nicotine products and leg elevation to aid with healing.  Orthopaedics has written for Keflex for home. Not currently on any antibiotics as IP.  Anticipate home with incisional VAC. May benefit from arranging home health prior to discharge.  I can see patient on 6.28.18 or 7.2.18 in my clinic and reexamine, remove incisional VAC if not already removed by Orthopaedic service at that time.  Irene Limbo, MD [facility] Plastic & Reconstructive Surgery [phone], pin [phone]

## 2017-01-10 ENCOUNTER — Encounter (HOSPITAL_COMMUNITY): Payer: Self-pay | Admitting: Orthopedic Surgery

## 2017-01-10 NOTE — Progress Notes (Signed)
CSW received a call from pt's Kelley Case Production designer, theatre/television/filmManager with John Kelley about safety concerns from the pt's mother and the pt about pt being D/C'd so late at night with no lights outside of the apartment, two flights of stairs, it's dark outside and the pt has a "wound vac" and the wound vac is not working correctly due to "hardware" in the pt's foot that resulted from an injury 6/23 and not suctioning properly.   Per pt's RN the wound vac is working properly but the wound "should have been redressed" earlier today by the day shift and it was not.  Thius, the wound vac is not working properly.  RN states she has many other patients and has to give them meds and does not have time to clean the wound again".  [place] at [place] one hour of clearance is required to allow the the pt to D/C once the wound vac begins to even start to work correctly.  John FieldRobin Kelley [phone] with John [facility]oventry as the [facility] Case Manager with the workman's comp injury for the pt.  CSW will call the pt's [facility] Case Manager with John Kelley with an update.  Pt's RN asked the CSW to involve the [facility].  Dorothe PeaJonathan F. Aneesah Hernan, Francesco SorLCSWA, LCAS, CSI Clinical Social Worker Ph: [phone]    '

## 2017-01-10 NOTE — Progress Notes (Signed)
    Subjective: 2 Days Post-Op Procedure(s) (LRB): IRRIGATION AND DEBRIDEMENT , closed reduction pinning of open foot fracture, application of wound vac. (Left) Patient reports pain as 6 on 0-10 scale.   Denies CP or SOB.  Voiding without difficulty. Positive flatus. Objective: Vital signs in last 24 hours: Temp:  [98.2 F (36.8 C)-98.6 F (37 C)] 98.6 F (37 C) (06/25 0451) Pulse Rate:  [71-82] 76 (06/25 0451) Resp:  [18-19] 18 (06/25 0451) BP: (115-130)/(54-64) 127/63 (06/25 0451) SpO2:  [99 %-100 %] 99 % (06/25 0451)  Intake/Output from previous day: 06/24 0701 - 06/25 0700 In: 5232.5 [P.O.:1660; I.V.:3273.7; IV Piggyback:198.8] Out: 2590 [Urine:2490; Drains:100] Intake/Output this shift: Total I/O In: 2956.8 [P.O.:460; I.V.:2248; Other:100; IV Piggyback:148.8] Out: 850 [Urine:750; Drains:100]  Labs:  Recent Labs  01/08/17 1042  HGB 12.4*    Recent Labs  01/08/17 1042  WBC 11.9*  RBC 3.67*  HCT 37.0*  PLT 194    Recent Labs  01/08/17 1042  NA 141  K 4.0  CL 110  CO2 25  BUN 16  CREATININE 0.88  GLUCOSE 110*  CALCIUM 8.7*   No results for input(s): LABPT, INR in the last 72 hours.  Physical Exam: Neurologically intact ABD soft Sensation intact distally Incision: wound vac in place  Compartment soft  Assessment/Plan: 2 Days Post-Op Procedure(s) (LRB): IRRIGATION AND DEBRIDEMENT , closed reduction pinning of open foot fracture, application of wound vac. (Left) Plan for DC today - medications on chart [facility] before DC Pt will be assessed by PT - NWB LLE Plastic [facility] [address] [facility] in 4 weeks  John Kelley, Baxter Kailarmen Kelley for Dr. Venita John Kelley [facility] [phone] 01/10/2017, 6:34 AM    Patient ID: [name], male   DOB: 1990-09-09, 26 y.o.   MRN: [id number]

## 2017-01-10 NOTE — Progress Notes (Signed)
Zella BallRobin, RN from [place] [phone] called for information for DME and HH. Fax [phone].

## 2017-01-10 NOTE — Progress Notes (Signed)
Physical Therapy Treatment Patient Details Name: John Kelley MRN: [id number] DOB: 1991-07-11 Today's Date: 01/10/2017    History of Present Illness 26 yo male admitted with L foot crush injury.s/p I&D, closed reduction pinning, wound VAC, splint applied 01/08/17    PT Comments    Seen a 2nd time today per evaluating therapist's request. Pt declines RW. Practiced gait training with crutches and mobilizing with knee scooter. Pt required Min assist with crutch use and Min guard assist with knee scooter use. Feel pt will benefit from both crutches and knee scooter to be able to mobilize efficiently and safely. Verbally reviewed "buttock scoot" technique for stair negotiation. Pain raged 8/10 with activity. Recommend HHPT f/u for at least a few visits for continued training with crutches and knee scooter to maximize independence and safety.     Follow Up Recommendations  Home health PT;Supervision/Assistance - 24 hour (initially until mobility safety improves)     Equipment Recommendations  3in1;Crutches;knee scooter   Recommendations for Other Services       Precautions / Restrictions Precautions Precautions: Fall Restrictions Weight Bearing Restrictions: Yes LLE Weight Bearing: Non weight bearing    Mobility  Bed Mobility               General bed mobility comments: oob in recliner  Transfers Overall transfer level: Needs assistance Equipment used: Crutches Transfers: Sit to/from Stand Sit to Stand: Min assist         General transfer comment: Assist to steady. VCs safety, technique, hand placement  Ambulation/Gait Ambulation/Gait assistance: Min assist Ambulation Distance (Feet): 60 Feet Assistive device: Crutches Gait Pattern/deviations: Step-to pattern     General Gait Details: Pt declined RW use for home. Practiced gait training x 1 with crutches-assist to steady. VCs safety, technique. Pt did well maintaining NWB status. x1 with knee scooter-min  guard assist. Cues for safety, operation.   Stairs Stairs:  (Per pt and evaluating therapist John Kelley), pt was instructed to use "buttock scoot" technique to ascend/descend stairs. Verbally reviewed this a 2nd time with pt. )          Wheelchair Mobility    Modified Rankin (Stroke Patients Only)       Balance                                            Cognition Arousal/Alertness: Awake/alert Behavior During Therapy: WFL for tasks assessed/performed Overall Cognitive Status: Within Functional Limits for tasks assessed                                        Exercises      General Comments        Pertinent Vitals/Pain Pain Assessment: 0-10 Pain Score: 8  Pain Location: L foot with activity Pain Descriptors / Indicators: Burning;Throbbing;Tender;Sore Pain Intervention(s): Monitored during session;Repositioned;Patient requesting pain meds-RN notified;Ice applied    Home Living                      Prior Function            PT Goals (current goals can now be found in the care plan section) Progress towards PT goals: Progressing toward goals    Frequency    Min 3X/week      PT Plan  Current plan remains appropriate    Co-evaluation              AM-PAC PT "6 Clicks" Daily Activity  Outcome Measure  Difficulty turning over in bed (including adjusting bedclothes, sheets and blankets)?: A Little Difficulty moving from lying on back to sitting on the side of the bed? : A Little Difficulty sitting down on and standing up from a chair with arms (e.g., wheelchair, bedside commode, etc,.)?: Total Help needed moving to and from a bed to chair (including a wheelchair)?: A Little Help needed walking in hospital room?: A Little Help needed climbing 3-5 steps with a railing? : A Lot 6 Click Score: 15    End of Session Equipment Utilized During Treatment: Gait belt Activity Tolerance: Patient tolerated treatment well  (but has increased pain with activity) Patient left: in chair;with call bell/phone within reach   PT Visit Diagnosis: Difficulty in walking, not elsewhere classified (R26.2);Pain Pain - Right/Left: Left Pain - part of body: Ankle and joints of foot     Time: 1525-1555 PT Time Calculation (min) (ACUTE ONLY): 30 min  Charges:  $Gait Training: 23-37 mins                    G Codes:          Rebeca AlertJannie Myangel Summons, MPT Pager: [phone]

## 2017-01-10 NOTE — Progress Notes (Signed)
[facility] [phone] and John Kelley [phone] was received. Claim #[id number] was given. [facility] [employer] [facility] [phone] was referred for HH(which was incorrect) and [facility] [phone] [facility], neither agency was correct for this claim. Waiting for a return call from John Kelley, CM with [facility].

## 2017-01-10 NOTE — Evaluation (Addendum)
Physical Therapy Evaluation Patient Details Name: John Kelley MRN: [id number] DOB: [birth year] Today's Date: 01/10/2017   History of Present Illness  L foot crush injury, s/p I&D, closed reduction, wound VAC, splint applied 01/08/17  Clinical Impression  Pt admitted with above diagnosis. Pt currently with functional limitations due to the deficits listed below (see PT Problem List). Pt ambulated 30' with RW, distance limited by pain. Will return later today for stair training and for trial of knee scooter.  Pt will benefit from skilled PT to increase their independence and safety with mobility to allow discharge to the venue listed below.       Follow Up Recommendations Home health PT;     Equipment Recommendations  Other (comment) (knee scooter) ; 3 in 1; crutches   Recommendations for Other Services       Precautions / Restrictions Precautions Precautions: Fall Restrictions Weight Bearing Restrictions: Yes LLE Weight Bearing: Non weight bearing      Mobility  Bed Mobility Overal bed mobility: Modified Independent Bed Mobility: Supine to Sit     Supine to sit: Modified independent (Device/Increase time)     General bed mobility comments: HOB up, and used bedrail  Transfers Overall transfer level: Needs assistance Equipment used: Rolling walker (2 wheeled) Transfers: Sit to/from Stand Sit to Stand: Min assist;From elevated surface         General transfer comment: VCs for hand placement, min assist to rise/steady  Ambulation/Gait Ambulation/Gait assistance: Min guard Ambulation Distance (Feet): 30 Feet Assistive device: Rolling walker (2 wheeled) Gait Pattern/deviations: Step-to pattern   Gait velocity interpretation: Below normal speed for age/gender General Gait Details: NWB LLE, steady, no LOB, distance limited by LLE pain  Stairs            Wheelchair Mobility    Modified Rankin (Stroke Patients Only)       Balance Overall balance  assessment: Needs assistance   Sitting balance-Leahy Scale: Good     Standing balance support: Bilateral upper extremity supported Standing balance-Leahy Scale: Poor Standing balance comment: relies on BUE support 2* NWB status                             Pertinent Vitals/Pain Pain Assessment: 0-10 Pain Score: 7  Pain Location: L foot Pain Intervention(s): Limited activity within patient's tolerance;Monitored during session;Patient requesting pain meds-RN notified;RN gave pain meds during session;Repositioned    Home Living Family/patient expects to be discharged to:: Private residence Living Arrangements: Parent Available Help at Discharge: Family;Available 24 hours/day (mother and her fiance available 24*) Type of Home: Apartment Home Access: Stairs to enter Entrance Stairs-Rails: Doctor, general practiceight;Left Entrance Stairs-Number of Steps: 12 Home Layout: One level Home Equipment: None      Prior Function Level of Independence: Independent               Hand Dominance        Extremity/Trunk Assessment   Upper Extremity Assessment Upper Extremity Assessment: Overall WFL for tasks assessed    Lower Extremity Assessment Lower Extremity Assessment: LLE deficits/detail LLE: Unable to fully assess due to pain LLE Sensation: decreased light touch (decr light touch at L great toe, rest of foot in dressing)    Cervical / Trunk Assessment Cervical / Trunk Assessment: Normal  Communication   Communication: No difficulties  Cognition Arousal/Alertness: Awake/alert Behavior During Therapy: WFL for tasks assessed/performed Overall Cognitive Status: Within Functional Limits for tasks assessed  General Comments      Exercises     Assessment/Plan    PT Assessment Patient needs continued PT services  PT Problem List Decreased strength;Decreased activity tolerance;Pain;Impaired sensation;Decreased  mobility;Decreased balance       PT Treatment Interventions DME instruction;Gait training;Stair training;Functional mobility training;Balance training;Therapeutic exercise;Therapeutic activities;Patient/family education    PT Goals (Current goals can be found in the Care Plan section)  Acute Rehab PT Goals Patient Stated Goal: basketball, golf PT Goal Formulation: With patient Time For Goal Achievement: 01/17/17 Potential to Achieve Goals: Good    Frequency Min 5X/week   Barriers to discharge        Co-evaluation               AM-PAC PT "6 Clicks" Daily Activity  Outcome Measure Difficulty turning over in bed (including adjusting bedclothes, sheets and blankets)?: A Little Difficulty moving from lying on back to sitting on the side of the bed? : A Little Difficulty sitting down on and standing up from a chair with arms (e.g., wheelchair, bedside commode, etc,.)?: Total Help needed moving to and from a bed to chair (including a wheelchair)?: A Little Help needed walking in hospital room?: A Little Help needed climbing 3-5 steps with a railing? : A Lot 6 Click Score: 15    End of Session Equipment Utilized During Treatment: Gait belt Activity Tolerance: Patient limited by pain Patient left: in chair;with call bell/phone within reach Nurse Communication: Mobility status PT Visit Diagnosis: Pain;Unsteadiness on feet (R26.81);Difficulty in walking, not elsewhere classified (R26.2) Pain - Right/Left: Left Pain - part of body: Ankle and joints of foot    Time: 1610-9604 PT Time Calculation (min) (ACUTE ONLY): 42 min   Charges:   PT Evaluation $PT Eval Low Complexity: 1 Procedure PT Treatments $Gait Training: 8-22 mins $Therapeutic Activity: 8-22 mins   PT G Codes:          Tamala Ser 01/10/2017, 9:50 AM [phone]

## 2017-01-10 NOTE — Progress Notes (Signed)
CSW updated pt's case manager at [employer] at ph: [phone].  Case manager aware pt is not [facility]C'ing, [facility] and [facility].  Provider is being notified at this time by RN and [facility] of safety concerns.  Please reconsult if future social work needs arise.  CSW signing off, as social work intervention is no longer needed.  Dorothe PeaJonathan F. Sedalia Greeson, Francesco SorLCSWA, LCAS, CSI Clinical Social Worker Ph: [phone]

## 2017-01-10 NOTE — Progress Notes (Addendum)
CSW received a call from the pt's RN who reported pt and family was to receive a "knee scooter" at D/C.  CSW provided RN with the phone number for and referred the RN to the CM for equipment needs.  CSW suggested RN place a CM consult and an order for a face-to-face for CM.  CSW then contacted the CM to alert the CM to the RN's request.  Please reconsult if future social work needs arise.    John Kelley, John SorLCSWA, LCAS, CSI Clinical Social Worker Ph: [phone]

## 2017-01-10 NOTE — Progress Notes (Addendum)
CSW involved AC and AC and CSW were informed by the pt's RN that the pt's wound/wound vac was changed today by a wound nurse and the mobile wound vac is not working properly, the pt is non-weight bearing on the left leg and the pt's scooter that supports his none-weight bearing knee is not available until 6/26.  [facility] and RN, pt's provider will be notified pt cannot safely D/C due to pt not having proper equipment (knee scooter) available at D/C that will enable the pt to care for himself at home and that the mobile wound vac is not working properly, as well.  CSW will update pt's case Production designer, theatre/television/filmmanager at [employer].  10:51 PM CSW informed by [facility] pt and family were updated and RN and [facility] are verifying provider is notified.  Please reconsult if future social work needs arise.  CSW signing off, as social work intervention is no longer needed.   Dorothe PeaJonathan F. Zyara Kelley, Francesco SorLCSWA, LCAS, CSI Clinical Social Worker Ph: [phone]

## 2017-01-10 NOTE — Care Management (Signed)
ED CM received a call from CSW in reference to the patient's need of a knee scooter. CSW states the patient is waiting on the knee scooter for discharge. CM called the patient's nurse and explained that per the notes entered earlier today by Cookie, CM the DME request was provided to [place] [facility]. Informed this CM will leave the patient's facesheet for the CM who worked on the DME request today to f/u on tomorrow. Provided the patient's nurse with the daytime CM's telephone number to provide to the patient. The nurse will instruct the patient to contact the daytime CM tomorrow if he does not receive an update related to the DME ordered. Rubie Maidrystal Trevelle Mcgurn RN CCM

## 2017-01-10 NOTE — Care Management (Signed)
ED CM received call from Dianah Fieldobin Stockner CM from [employer] concerning patient's equipment and transport arrangements home. ED CM explained due to after hours  suggested she contact daytime CM Cookie McGibboney tomorrow morning.

## 2017-01-10 NOTE — Progress Notes (Signed)
[place], [place] [phone] from [facility] called to request orders for [facility] and DME to be faxed to [phone].

## 2017-01-10 NOTE — Progress Notes (Signed)
Orthopedic Tech Progress Note Patient Details:  John Kelley [birth year] [id number]  Physical Therapy requested for crutches to be dropped off at Pt room. Physical Therapy stated they would instruct Pt on use as he is waiting on a dressing change. Informed Pt and Pt's RN to call Ortho Tech back for instruction on use as needed. Ortho Devices Type of Ortho Device: Crutches Ortho Device/Splint Location: Crutches for Lt Foot Ortho Device/Splint Interventions: Minda MeoOrdered   Javelle Donigan S Nichalos Brenton 01/10/2017, 1:22 PM

## 2017-01-11 MED ORDER — CEPHALEXIN 500 MG PO CAPS
500.0000 mg | ORAL_CAPSULE | Freq: Four times a day (QID) | ORAL | Status: DC
  Administered 2017-01-11 (×3): 500 mg via ORAL
  Filled 2017-01-11 (×3): qty 1

## 2017-01-11 NOTE — Progress Notes (Signed)
Assessment unchanged. Pt and mother verbalized understanding of dc instructions. Home Health to follow up tomorrow. Mother has contact information for home health agency. Dr. Shon BatonBrooks gave mom scripts on rounds at noon. Home wound vac applied with good suction and no problems. LLE splint applied and secured with  ace-wrap prior to leaving. Discharged via wc to front entrance to meet mom. Accompanied by NT and nurse. All wound vac equipment and supplies with pt.

## 2017-01-11 NOTE — Discharge Summary (Signed)
Patient ID: John Kelley MRN: [id number] DOB/AGE: 1990/10/10 26 y.o.  Admit date: 01/08/2017 Discharge date: 01/11/2017  Admission Diagnoses:  Active Problems:   Unspecified fracture of left foot, initial encounter for open fracture   Discharge Diagnoses:  Active Problems:   Unspecified fracture of left foot, initial encounter for open fracture  status post Procedure(s): IRRIGATION AND DEBRIDEMENT , closed reduction pinning of open foot fracture, application of wound vac.  History reviewed. No pertinent past medical history.  Surgeries: Procedure(s): IRRIGATION AND DEBRIDEMENT , closed reduction pinning of open foot fracture, application of wound vac. on 01/08/2017   Consultants: Treatment Team:  Venita LickBrooks, Jerric Oyen, MD    Discharged Condition: Improved  Hospital Course: John Kelley is an 26 y.o. male who was admitted 01/08/2017 for operative treatment of opne left foot injury (crush injury) Patient was taken to the operating room on 01/08/2017 and underwent  Procedure(s): IRRIGATION AND DEBRIDEMENT , closed reduction pinning of open foot fracture, application of wound vac.John Kelley.    Patient was given perioperative antibiotics: Anti-infectives    Start     Dose/Rate Route Frequency Ordered Stop   01/11/17 0015  cephALEXin (KEFLEX) capsule 500 mg     500 mg Oral Every 6 hours 01/11/17 0012     01/09/17 1445  ceFAZolin (ANCEF) IVPB 1 g/50 mL premix  Status:  Discontinued     1 g 100 mL/hr over 30 Minutes Intravenous Every 8 hours 01/09/17 1446 01/11/17 0011   01/09/17 0000  cephALEXin (KEFLEX) 500 MG capsule     500 mg Oral 4 times daily 01/09/17 0801     01/08/17 1800  ceFAZolin (ANCEF) IVPB 1 g/50 mL premix     1 g 100 mL/hr over 30 Minutes Intravenous Every 6 hours 01/08/17 1612 01/09/17 0607   01/08/17 1300  ceFAZolin (ANCEF) IVPB 2g/100 mL premix  Status:  Discontinued     2 g 200 mL/hr over 30 Minutes Intravenous  Once 01/08/17 1253 01/11/17 0011   01/08/17 1231   ceFAZolin (ANCEF) 2-4 GM/100ML-% IVPB    Comments:  Juanda Crumbleanney, Michael   : cabinet override      01/08/17 1231 01/09/17 0044   01/08/17 1100  ceFAZolin (ANCEF) IVPB 1 g/50 mL premix     1 g 100 mL/hr over 30 Minutes Intravenous  Once 01/08/17 1045 01/08/17 1120       Patient was given sequential compression devices and early ambulation to prevent DVT.   Patient benefited maximally from hospital stay and there were no complications. At the time of discharge, the patient was urinating/moving their bowels without difficulty, tolerating a regular diet, pain is controlled with oral pain medications and they have been cleared by PT/OT.   Postoperative day 1. Patient was noted to be neurovascularly intact, hemodynamically stable. No evidence of fevers chills or progressive issues. The wound VAC was functioning fine without any issues. X-rays demonstrated the foot to be in a near anatomical position. Closed reduction pinning appeared to be stable. Patient was seen by Dr. Leta Baptisthimmappa from plastic surgery. She agreed to evaluate the patient in the postoperative setting for potential wound healing complications. Intraoperative findings suggested significant soft tissue injury. This is most likely from resulting from the crush injury.  Postoperative day 2. Patient remained stable no significant active issues. Attempted to discharge patient to home with appropriate wound care and [facility]. However this was not possible given issues with insurance. No significant issues during hospital day #2. He was mobile nonweightbearing.  Postoperative day 3. Plan for discharge to home. All appropriate durable medical goods of been provided. I've spoken directly with Dr. Leta Baptist and agreed that patient will follow up with her on the 28th of this month. The patient will subsequently see Dr. Victorino Dike in about a month for repeat x-rays. At this point time after talking with Dr. Victorino Dike its contraindicated to consider definitive  open reduction internal fixation of his multiple fractures given the significantly compromised soft tissue sleeve. The plan will be for the patient to follow-up with plastic surgery for wound care. Once the soft tissues have declared themselves then we can determine the next best course of action as relates to the multiple fractures.  Recent vital signs: Patient Vitals for the past 24 hrs:  BP Temp Temp src Pulse Resp SpO2  01/11/17 0515 120/69 98.3 F (36.8 C) Oral 75 18 99 %  01/10/17 2209 (!) 124/57 98.9 F (37.2 C) Oral 90 18 100 %  01/10/17 1614 138/78 98.9 F (37.2 C) Oral 94 18 99 %     Recent laboratory studies: No results for input(s): WBC, HGB, HCT, PLT, NA, K, CL, CO2, BUN, CREATININE, GLUCOSE, INR, CALCIUM in the last 72 hours.  Invalid input(s): PT, 2   Discharge Medications:   Allergies as of 01/11/2017   No Known Allergies     Medication List    TAKE these medications   aspirin EC 325 MG tablet Take 1 tablet (325 mg total) by mouth daily.   cephALEXin 500 MG capsule Commonly known as:  KEFLEX Take 1 capsule (500 mg total) by mouth 4 (four) times daily.   docusate sodium 100 MG capsule Commonly known as:  COLACE Take 1 capsule (100 mg total) by mouth 2 (two) times daily as needed for mild constipation.   methocarbamol 500 MG tablet Commonly known as:  ROBAXIN Take 1 tablet (500 mg total) by mouth every 6 (six) hours as needed for muscle spasms.   oxyCODONE-acetaminophen 5-325 MG tablet Commonly known as:  PERCOCET Take 1-2 tablets by mouth every 4 (four) hours as needed for severe pain.            Durable Medical Equipment        Start     Ordered   01/10/17 1613  For home use only DME Other see comment  Once    Comments:  Knee Scooter   01/10/17 1613   01/10/17 1602  For home use only DME 3 n 1  Once     01/10/17 1601      Diagnostic Studies: Dg Ankle Complete Left  Result Date: 01/08/2017 CLINICAL DATA:  Multiple severe rib fractures.   Crush injury. EXAM: LEFT ANKLE COMPLETE - 3+ VIEW COMPARISON:  None. FINDINGS: Horizontal fracture through the lateral malleolus. The ankle mortise intact. Talar dome appears normal. Multiple severe tarsal and metatarsal fractures described on foot film. IMPRESSION: 1. Horizontal fracture of the lateral malleolus. 2. Ankle mortise intact. 3. Severe comminuted fractures of the tarsal row and proximal metatarsal row. Recommend CT ankle and foot. Electronically Signed   By: Genevive Bi M.D.   On: 01/08/2017 11:26   Ct Foot Left Wo Contrast  Result Date: 01/08/2017 CLINICAL DATA:  Crush injury of the left foot with multiple open fractures. The patient's foot was run over by a forklift. EXAM: CT OF THE LEFT FOOT WITHOUT CONTRAST TECHNIQUE: Multidetector CT imaging of the left foot was performed according to the standard protocol. Multiplanar CT image reconstructions were also  generated. COMPARISON:  Radiographs dated 01/08/2017 FINDINGS: Bones/Joint/Cartilage Distal fibula: There is a transverse fracture through the lateral malleolus without displacement. Talus: Small avulsions from the distal dorsal lateral aspect of the talus. Calcaneus: Small avulsion fractures lateral plantar aspect of the distal calcaneus at the calcaneocuboid joint. Navicular: There is a focal avulsion fracture of the medial plantar aspect of the navicular. Cuboid: There is a comminuted fracture of the lateral aspect of the cuboid with gas in the adjacent soft tissues as well as within the bone. Cuneiforms: There is disruption of the plantar ligaments at the cuneiforms. There small avulsions from the medial cuneiform with the severely comminuted fracture of the plantar aspect of middle and lateral cuneiforms. Extensive gas in the soft tissues. Metatarsals: Severely comminuted fracture of the proximal fifth metatarsal with spiral fracture extending throughout the shaft of the fifth metatarsal. There is dislocation at fifth MTP joint. There  is a severely comminuted fracture of the base of second metatarsal. There are small avulsions from plantar aspect of the base of the third metatarsal. Fourth metatarsal appears to be intact. First metatarsal is intact. Soft tissues There is extensive gas in the soft tissues of the foot particularly dorsally. There is gas in some of the bones including the cuboid, navicular, and talus There is an abnormal fluid collection at the dorsal aspect distal forefoot which probably represents a seroma, also containing air. IMPRESSION: Extensive fractures of left foot as described. Electronically Signed   By: Francene Boyers M.D.   On: 01/08/2017 12:56   Dg Foot 2 Views Left  Result Date: 01/08/2017 CLINICAL DATA:  Status post percutaneous fixation of the left foot after crush injury. EXAM: LEFT FOOT - 2 VIEW COMPARISON:  Left foot CT 01/08/2017 FINDINGS: There are 3 percutaneous fixation wires traversing the metatarsal bases and the tarsometatarsal articulation. There is no visible hardware abnormality. Alignment is greatly improved and anatomic. IMPRESSION: Anatomic alignment of the left foot following percutaneous fixation. Electronically Signed   By: Deatra Robinson M.D.   On: 01/08/2017 17:01   Dg Foot Complete Left  Result Date: 01/08/2017 CLINICAL DATA:  Pt c/o pain to obv deformity, open wounds to foot/ankle s/p crush inj today, extremity ran over by a forklift at his job. EXAM: LEFT FOOT - COMPLETE 3+ VIEW COMPARISON:  None FINDINGS: There is fractures of the proximal aspect of the second third fourth and fifth metatarsals. Severe comminution of the base of the fifth metatarsal. The fifth metatarsal is dislocated at the metatarsal-phalangeal joint. The metatarsal tarsal joint is poorly evaluated and likely disrupted. On lateral projection the cuboid bone is fractured. Calcaneus appears normal. IMPRESSION: 1. Comminuted fractures of the base of the second, third, fourth and fifth metatarsals. 2. Fifth metatarsal  base severely comminuted. Fifth metatarsal phalangeal joint dislocation. 3. Fracture of the cuboid bone. 4. Disruption of the tarsal metatarsal joints. Concern for additional tarsal fractures. Recommend CT of the foot. Electronically Signed   By: Genevive Bi M.D.   On: 01/08/2017 11:24      Follow-up Information    Glenna Fellows, MD Follow up on 01/13/2017.   Specialty:  Plastic Surgery Why:  1130 am Contact information: [address] [place] [address] [phone]        Toni Arthurs, MD. Schedule an appointment as soon as possible for a visit in 4 week(s).   Specialty:  Orthopedic Surgery Why:  fracture follow up Contact information: [address] [place] [address] [phone]  Discharge Plan:  discharge to Home.  Disposition:  1. Follow-up this week for wound reevaluation with Dr. Leta Baptist  2.  follow up in 1 month's with Dr. Victorino Dike for reevaluation of fractures.  3. He will remain out of work as at current time he is unable to return to gainful full-time employment.   4. Discharged with antibiotics, narcotic pain medications, and all appropriate durable medical goods. This includes wound VAC equipment.  I discussed the plan with the patient and his mother they're both aware of it and it expressed an understanding. I've also explained to him that given the severity of the soft tissue and the bony injury he will require more surgeries. One of the big risks is infection and potentially the need for amputation.    Signed: Venita Lick D for Dr. Venita Lick [facility] [phone] 01/11/2017, 12:04 PM

## 2017-01-11 NOTE — Progress Notes (Signed)
[facility] [employer] [employer] will manage pt's DME and HH. [facility]. Zella Ballobin, CM with [facility] [phone], fax [phone].

## 2017-01-11 NOTE — Progress Notes (Signed)
    Subjective: 3 Days Post-Op Procedure(s) (LRB): IRRIGATION AND DEBRIDEMENT , closed reduction pinning of open foot fracture, application of wound vac. (Left) Patient reports pain as 2 on 0-10 scale.   Denies CP or SOB.  Voiding without difficulty. Positive flatus. Objective: Vital signs in last 24 hours: Temp:  [98.3 F (36.8 C)-98.9 F (37.2 C)] 98.3 F (36.8 C) (06/26 0515) Pulse Rate:  [75-94] 75 (06/26 0515) Resp:  [18] 18 (06/26 0515) BP: (120-138)/(57-78) 120/69 (06/26 0515) SpO2:  [99 %-100 %] 99 % (06/26 0515)  Intake/Output from previous day: 06/25 0701 - 06/26 0700 In: 1768.8 [P.O.:1320; I.V.:393.8; IV Piggyback:55] Out: 1600 [Urine:1600] Intake/Output this shift: No intake/output data recorded.  Labs: No results for input(s): HGB in the last 72 hours. No results for input(s): WBC, RBC, HCT, PLT in the last 72 hours. No results for input(s): NA, K, CL, CO2, BUN, CREATININE, GLUCOSE, CALCIUM in the last 72 hours. No results for input(s): LABPT, INR in the last 72 hours.  Physical Exam: Neurologically intact Incision: vac in place Compartment soft  Assessment/Plan: 3 Days Post-Op Procedure(s) (LRB): IRRIGATION AND DEBRIDEMENT , closed reduction pinning of open foot fracture, application of wound vac. (Left) Advance diet  Plan on d/c to home once equipment issues resolved F/U 6/28 with Dr Leta Baptisthimmappa Will defer soft tissue / wound management to plastic surgery F/u 4 weeks with Dr Victorino DikeHewitt to re-evaluate bone healing Primary issue at this time is soft tissue.  Spoke with Dr Victorino DikeHewitt - agree with the plan Patient not a candidate for ORIF given extensive soft tissue injury (crush injury)   Landy Mace D for Dr. Venita Lickahari Ikram Riebe [facility] [phone] 01/11/2017, 11:54 AM

## 2017-01-11 NOTE — Progress Notes (Signed)
Porfirio Mylararmen, Ortho PA, called back with clarification on wound vac pressure and after talking to [employer] rep.,  instructed nursing to apply home wound vac at dc with 125 pressure as programmed.

## 2017-01-11 NOTE — Progress Notes (Signed)
Dr. Shon BatonBrooks in to see and follow-up with pt and mother. See notes.

## 2017-01-11 NOTE — Progress Notes (Signed)
Spoke with Zella Ballobin, CM with [employer] [facility]. There are two other companies calling for information [employer] and [employer] asking for the same information that was faxed to [employer]. Zella BallRobin states that she will handle HH and DME. There is no need for the other two companies to call. [employer] have placed the wound VAC and equipment is in pt's room. Crutches provided by hospital are in pt's room. There is no reason to continue to hold pt. [employer] will setup HH for pt. [employer] would not accept HH orders in Epic, was instructed to call MD.

## 2017-01-11 NOTE — Progress Notes (Signed)
[facility] [place] [phone] [facility].

## 2017-01-11 NOTE — Progress Notes (Signed)
Physical Therapy Treatment Patient Details Name: John Kelley MRN: [id number] DOB: 17-May-1991 Today's Date: 01/11/2017    History of Present Illness 26 yo male admitted with L foot crush injury.s/p I&D, closed reduction pinning, wound VAC, splint applied 01/08/17    PT Comments    Pt plans to D/C to home today.  Mom present for "hands on" instruction for safe handling with transfers, gait and stairs.  Pt instructed on crutch safety and advised to feed VAC tubing up through shorts/pants to avoid tripping/tubing dragging on floor/tip of crutch getting caught on tubing.  Practices stairs with mom.  Addressed all mobility questions.  Encouraged L LE elevation and mild ankle pumps.  Follow Up Recommendations  Home health PT;Supervision/Assistance - 24 hour     Equipment Recommendations  3in1 (PT);Crutches;Other (comment) (knee scooter)    Recommendations for Other Services       Precautions / Restrictions Precautions Precautions: Fall Restrictions Weight Bearing Restrictions: Yes LLE Weight Bearing: Non weight bearing    Mobility  Bed Mobility Overal bed mobility: Modified Independent Bed Mobility: Supine to Sit     Supine to sit: Modified independent (Device/Increase time)     General bed mobility comments: increased time  Transfers Overall transfer level: Needs assistance Equipment used: Crutches Transfers: Sit to/from [employer];Stand Pivot Transfers Sit to Stand: Supervision;Min guard Stand pivot transfers: Supervision;Min guard       General transfer comment: 25% VC's safety with turns esp mindful of wound VAC tubing tripping hazzard and crutch bottom placement as well as crutch transfer all while NWBing L LE  Ambulation/Gait Ambulation/Gait assistance: Supervision;Min guard Ambulation Distance (Feet): 125 Feet Assistive device: Crutches Gait Pattern/deviations: Step-to pattern Gait velocity: too quick/instructed to "slow down"   General Gait Details: with  mom present for "hands on" instruction on safe handling while pt amb with B crutches.  25% VC's on safety with turns, proper crutch placement and to decrease gait speed to increase safety.  Instructed on environmental hazzards such as floor to carpet transition and uneven surfaces (ex outside).    Stairs Stairs: Yes   Stair Management: One rail Right;Step to pattern;Forwards;With crutches Number of Stairs: 12 General stair comments: with Mom present for "hands on" instruction pt practiced decending strairs first as his apartment is below ground level.  Pt instructed to keep L foot out front with decend to avoid tripping hazzard on step.  Pt instructed to keep L foot to back when ascending stairs to avoid tripping hazzard.  Also instructed on proper crutch transfer and placement (B crutches under arm opposite side of rail)   Wheelchair Mobility    Modified Rankin (Stroke Patients Only)       Balance                                            Cognition Arousal/Alertness: Awake/alert Behavior During Therapy: WFL for tasks assessed/performed Overall Cognitive Status: Within Functional Limits for tasks assessed                                        Exercises      General Comments        Pertinent Vitals/Pain Pain Assessment: Faces Faces Pain Scale: Hurts little more Pain Location: L foot with activity Pain Descriptors / Indicators: Burning;Throbbing;Tender;Sore  Pain Intervention(s): Monitored during session;Repositioned    Home Living                      Prior Function            PT Goals (current goals can now be found in the care plan section) Progress towards PT goals: Progressing toward goals    Frequency    Min 3X/week      PT Plan Current plan remains appropriate    Co-evaluation              AM-PAC PT "6 Clicks" Daily Activity  Outcome Measure  Difficulty turning over in bed (including adjusting  bedclothes, sheets and blankets)?: Total Difficulty moving from lying on back to sitting on the side of the bed? : Total Difficulty sitting down on and standing up from a chair with arms (e.g., wheelchair, bedside commode, etc,.)?: Total Help needed moving to and from a bed to chair (including a wheelchair)?: A Little Help needed walking in hospital room?: A Little Help needed climbing 3-5 steps with a railing? : A Little 6 Click Score: 12    End of Session Equipment Utilized During Treatment: Gait belt Activity Tolerance: Patient tolerated treatment well Patient left: in bed;with call bell/phone within reach;with nursing/sitter in room;with family/visitor present Nurse Communication: Mobility status (pt practiced stairs and still waiting in scooter) PT Visit Diagnosis: Difficulty in walking, not elsewhere classified (R26.2);Pain     Time: 4098-1191 PT Time Calculation (min) (ACUTE ONLY): 32 min  Charges:  $Gait Training: 8-22 mins $Therapeutic Activity: 8-22 mins                    G Codes:       {John Kelley  PTA [facility]  Acute  Rehab Pager      [phone]

## 2017-01-11 NOTE — Consult Note (Signed)
Contacted by [employer] representative while [facility].  Patient per staff has orders to DC to home.  [employer] representative has been asked to place current NPWT dressing to a Prevena Plus machine for DC to home. [facility] reports that current dressing application (large amounts of ioban over pins and toes along with use of two TRAC pads would not allow for connection to the Prevena for DC to home.  Bedside nurse and [employer] rep have been in the room to troubleshoot and have removed the excessive ioban dressing from around the patients toes which was not successful for use with the Prevena.  [employer] rep and bedside nurse have bridged the two dorsal foot incisions together to allow for the use of one TRAC pad as well with no success for a seal with the Prevena. WOC arrived to the room to assist with troubleshooting dressing in order to attach to the Prevena for DC to home. Patient has xeroform gauze along the posterior aspect of the achilles/heel area medially.  PA [place] with Dr. Darden [employer] is aware of current issues with the use of Prevena for DC to home.  Current NPWT dressing is reconnected to hospital VAC unit and seal is obtained immediately.  WOC has no other suggestions to offer at this time outside of removing the entire dressing and replacing dressing with incisional VAC dressings over the dorsal foot only (isolated from the pin sites, heel and toes. [employer] representative and WOC nurse agree that this would be very labor intensive and still may not increase chance of success with Prevena unit.  Decision to maintain dressing with hospital unit and [employer] representative to work on coverage and delivery of home [employer] unit to the patient's room for DC to home.    Addendum: 1610: 1830 contacted bedside nurse after [facility] from [facility] that home unit has been approved by workman's comp CM to talk bedside nurse through hooking current NPWT dressing to home unit. She verbalized understanding. She will educate  patient on canister change and charging of home unit prior to DC.  Rekisha Welling [facility] MSN,RN,CWOCN, CNS, [employer] [phone]

## 2017-01-11 NOTE — Progress Notes (Signed)
[place], [place] aware via phone that portable wound vac arrived late yesterday and night shift reported issues with leaking. Pump working well this am yet pressure setting not compatible with current orders. Porfirio MylarCarmen to call and clarify settings. [facility]ing [facility] from [facility] and also waiting on DME case manager ordered this am needed for home use.

## 2017-01-24 ENCOUNTER — Encounter (HOSPITAL_BASED_OUTPATIENT_CLINIC_OR_DEPARTMENT_OTHER): Payer: Self-pay | Admitting: *Deleted

## 2017-01-24 NOTE — H&P (Signed)
Subjective:     Patient ID: John Kelley is a 26 y.o. male.  Follow-up  Reports no smoking. Seen by Dr. Victorino DikeHewitt- note not signed, per Case Worker 3rd MT no aligned, will need pin removal in office in few weeks.  DOI 6.23.18 work related injury with forklift dropping on left foot. Injuries consistent with significant crush injury including soft tissue laceration over lateral foot and puncture injury heel, MT fractures 2-5 with comminution and dislocation 5th MT base, additional fracture cuboid. Underwent PP MT fractures, PP cuboid, release comparments and I&D soft tissue by Dr. Shon BatonBrooks, compartment release and laceration skin left open and VAC applied. Per chart, not candidate for ORIF due to soft tissue injury.       Objective:   Physical Exam  Cardiovascular: Normal rate, regular rhythm and normal heart sounds.   Pulmonary/Chest: Effort normal and breath sounds normal.   VAC removed Pin sites clean Decreased edema  Over dorsum foot compartment release between MT1-2 , MT4-5 each contracted and 100% granulation 2.5x 0.8 cm  Dorsum foot with necrotic skin eschar and gray color with at least one area thrombosed vessels noted skin- area of concern approximately 7 x 10 cm No cellulitis    Assessment:     Crush injury left foot with open fracture history s/p PP MT2-5, cuboid    Plan:      Coverage will be dependent on amount soft tissue loss. Plan debridement this week.  If skin only anticipate will have exposure tendons and plan skin substitute Integra and eventual grafting. If more severe, may need to consider free tissue transfer.  Applied silver alginate and dry dressing today.Hold VAC until time of surgery.   John FellowsBrinda Haset Oaxaca, MD [facility] Plastic & Reconstructive Surgery [phone], pin [phone]

## 2017-01-25 ENCOUNTER — Ambulatory Visit (HOSPITAL_BASED_OUTPATIENT_CLINIC_OR_DEPARTMENT_OTHER): Payer: Worker's Compensation | Admitting: Anesthesiology

## 2017-01-25 ENCOUNTER — Encounter (HOSPITAL_BASED_OUTPATIENT_CLINIC_OR_DEPARTMENT_OTHER)
Admission: RE | Disposition: A | Discharge status: Home / Self Care | Payer: Self-pay | Source: Ambulatory Visit | Attending: Plastic Surgery

## 2017-01-25 ENCOUNTER — Ambulatory Visit (HOSPITAL_BASED_OUTPATIENT_CLINIC_OR_DEPARTMENT_OTHER)
Admission: RE | Admit: 2017-01-25 | Discharge: 2017-01-25 | Disposition: A | Discharge status: Home / Self Care | Payer: Worker's Compensation | Source: Ambulatory Visit | Attending: Plastic Surgery | Admitting: Plastic Surgery

## 2017-01-25 ENCOUNTER — Encounter (HOSPITAL_BASED_OUTPATIENT_CLINIC_OR_DEPARTMENT_OTHER): Payer: Self-pay | Admitting: *Deleted

## 2017-01-25 DIAGNOSIS — Y990 Civilian activity done for income or pay: Secondary | ICD-10-CM | POA: Diagnosis not present

## 2017-01-25 DIAGNOSIS — W230XXA Caught, crushed, jammed, or pinched between moving objects, initial encounter: Secondary | ICD-10-CM | POA: Insufficient documentation

## 2017-01-25 DIAGNOSIS — S91312A Laceration without foreign body, left foot, initial encounter: Secondary | ICD-10-CM | POA: Diagnosis not present

## 2017-01-25 DIAGNOSIS — Z87891 Personal history of nicotine dependence: Secondary | ICD-10-CM | POA: Diagnosis not present

## 2017-01-25 HISTORY — PX: INCISION AND DRAINAGE OF WOUND: SHX1803

## 2017-01-25 SURGERY — IRRIGATION AND DEBRIDEMENT WOUND
Anesthesia: General | Site: Foot | Laterality: Left

## 2017-01-25 MED ORDER — HYDROCODONE-ACETAMINOPHEN 5-325 MG PO TABS: 1.0000 | ORAL_TABLET | ORAL | 0 refills | Status: DC | PRN

## 2017-01-25 MED ORDER — 0.9 % SODIUM CHLORIDE (POUR BTL) OPTIME
TOPICAL | Status: DC | PRN
  Administered 2017-01-25: 300 mL

## 2017-01-25 MED ORDER — CEFAZOLIN SODIUM-DEXTROSE 2-4 GM/100ML-% IV SOLN
INTRAVENOUS | Status: AC
  Filled 2017-01-25: qty 100

## 2017-01-25 MED ORDER — OXYCODONE HCL 5 MG PO TABS
5.0000 mg | ORAL_TABLET | Freq: Once | ORAL | Status: AC | PRN
  Administered 2017-01-25: 5 mg via ORAL

## 2017-01-25 MED ORDER — ONDANSETRON HCL 4 MG/2ML IJ SOLN
INTRAMUSCULAR | Status: DC | PRN
Start: 2017-01-25 — End: 2017-01-25
  Administered 2017-01-25: 4 mg via INTRAVENOUS

## 2017-01-25 MED ORDER — DEXAMETHASONE SODIUM PHOSPHATE 4 MG/ML IJ SOLN
INTRAMUSCULAR | Status: DC | PRN
  Administered 2017-01-25: 10 mg via INTRAVENOUS

## 2017-01-25 MED ORDER — LACTATED RINGERS IV SOLN
INTRAVENOUS | Status: DC
  Administered 2017-01-25 (×2): via INTRAVENOUS

## 2017-01-25 MED ORDER — CEFAZOLIN SODIUM-DEXTROSE 2-4 GM/100ML-% IV SOLN
2.0000 g | INTRAVENOUS | Status: AC
  Administered 2017-01-25: 2 g via INTRAVENOUS

## 2017-01-25 MED ORDER — LIDOCAINE HCL (CARDIAC) 20 MG/ML IV SOLN
INTRAVENOUS | Status: DC | PRN
  Administered 2017-01-25: 50 mg via INTRAVENOUS

## 2017-01-25 MED ORDER — MEPERIDINE HCL 25 MG/ML IJ SOLN: 6.2500 mg | INTRAMUSCULAR | Status: DC | PRN

## 2017-01-25 MED ORDER — FENTANYL CITRATE (PF) 100 MCG/2ML IJ SOLN
INTRAMUSCULAR | Status: AC
  Filled 2017-01-25: qty 2

## 2017-01-25 MED ORDER — OXYCODONE HCL 5 MG PO TABS
ORAL_TABLET | ORAL | Status: AC
  Filled 2017-01-25: qty 1

## 2017-01-25 MED ORDER — HYDROMORPHONE HCL 1 MG/ML IJ SOLN
0.2500 mg | INTRAMUSCULAR | Status: DC | PRN
  Administered 2017-01-25 (×2): 0.5 mg via INTRAVENOUS

## 2017-01-25 MED ORDER — SCOPOLAMINE 1 MG/3DAYS TD PT72: 1.0000 | MEDICATED_PATCH | Freq: Once | TRANSDERMAL | Status: DC | PRN

## 2017-01-25 MED ORDER — HYDROMORPHONE HCL 1 MG/ML IJ SOLN
INTRAMUSCULAR | Status: AC
  Filled 2017-01-25: qty 0.5

## 2017-01-25 MED ORDER — MIDAZOLAM HCL 2 MG/2ML IJ SOLN
1.0000 mg | INTRAMUSCULAR | Status: DC | PRN
  Administered 2017-01-25: 2 mg via INTRAVENOUS

## 2017-01-25 MED ORDER — OXYCODONE HCL 5 MG/5ML PO SOLN: 5.0000 mg | Freq: Once | ORAL | Status: AC | PRN

## 2017-01-25 MED ORDER — CEFAZOLIN SODIUM 1 G IJ SOLR
INTRAMUSCULAR | Status: AC
  Filled 2017-01-25: qty 20

## 2017-01-25 MED ORDER — PROMETHAZINE HCL 25 MG/ML IJ SOLN: 6.2500 mg | INTRAMUSCULAR | Status: DC | PRN

## 2017-01-25 MED ORDER — PROPOFOL 10 MG/ML IV BOLUS
INTRAVENOUS | Status: DC | PRN
  Administered 2017-01-25: 200 mg via INTRAVENOUS

## 2017-01-25 MED ORDER — FENTANYL CITRATE (PF) 100 MCG/2ML IJ SOLN
50.0000 ug | INTRAMUSCULAR | Status: DC | PRN
  Administered 2017-01-25 (×2): 50 ug via INTRAVENOUS

## 2017-01-25 MED ORDER — MIDAZOLAM HCL 2 MG/2ML IJ SOLN
INTRAMUSCULAR | Status: AC
  Filled 2017-01-25: qty 2

## 2017-01-25 SURGICAL SUPPLY — 64 items
APL SKNCLS STERI-STRIP NONHPOA (GAUZE/BANDAGES/DRESSINGS) ×1
BAG DECANTER FOR FLEXI CONT (MISCELLANEOUS) ×3 IMPLANT
BENZOIN TINCTURE PRP APPL 2/3 (GAUZE/BANDAGES/DRESSINGS) ×2 IMPLANT
BLADE HEX COATED 2.75 (ELECTRODE) ×3 IMPLANT
BLADE SURG 10 STRL SS (BLADE) ×4 IMPLANT
BLADE SURG 15 STRL LF DISP TIS (BLADE) IMPLANT
BLADE SURG 15 STRL SS (BLADE)
BNDG GAUZE ELAST 4 BULKY (GAUZE/BANDAGES/DRESSINGS) ×2 IMPLANT
CANISTER SUCT 1200ML W/VALVE (MISCELLANEOUS) ×3 IMPLANT
CHLORAPREP W/TINT 26ML (MISCELLANEOUS) IMPLANT
COVER BACK TABLE 60X90IN (DRAPES) ×3 IMPLANT
COVER MAYO STAND STRL (DRAPES) ×1 IMPLANT
DECANTER SPIKE VIAL GLASS SM (MISCELLANEOUS) IMPLANT
DRAIN CHANNEL 15F RND FF W/TCR (WOUND CARE) IMPLANT
DRAPE EXTREMITY T 121X128X90 (DRAPE) ×2 IMPLANT
DRAPE LAPAROSCOPIC ABDOMINAL (DRAPES) ×1 IMPLANT
DRAPE U-SHAPE 76X120 STRL (DRAPES) IMPLANT
DRSG ADAPTIC 3X8 NADH LF (GAUZE/BANDAGES/DRESSINGS) ×2 IMPLANT
DRSG PAD ABDOMINAL 8X10 ST (GAUZE/BANDAGES/DRESSINGS) IMPLANT
DRSG TELFA 3X8 NADH (GAUZE/BANDAGES/DRESSINGS) ×3 IMPLANT
ELECT COATED BLADE 2.86 ST (ELECTRODE) IMPLANT
ELECT REM PT RETURN 9FT ADLT (ELECTROSURGICAL) ×3
ELECTRODE REM PT RTRN 9FT ADLT (ELECTROSURGICAL) ×1 IMPLANT
EVACUATOR SILICONE 100CC (DRAIN) IMPLANT
GAUZE SPONGE 4X4 12PLY STRL (GAUZE/BANDAGES/DRESSINGS) IMPLANT
GAUZE SPONGE 4X4 12PLY STRL LF (GAUZE/BANDAGES/DRESSINGS) IMPLANT
GAUZE XEROFORM 1X8 LF (GAUZE/BANDAGES/DRESSINGS) ×6 IMPLANT
GLOVE BIO SURGEON STRL SZ 6 (GLOVE) ×3 IMPLANT
GLOVE BIO SURGEON STRL SZ 6.5 (GLOVE) ×1 IMPLANT
GLOVE BIO SURGEON STRL SZ7 (GLOVE) ×2 IMPLANT
GLOVE BIO SURGEONS STRL SZ 6.5 (GLOVE) ×1
GLOVE BIOGEL PI IND STRL 7.0 (GLOVE) IMPLANT
GLOVE BIOGEL PI IND STRL 7.5 (GLOVE) IMPLANT
GLOVE BIOGEL PI INDICATOR 7.0 (GLOVE) ×2
GLOVE BIOGEL PI INDICATOR 7.5 (GLOVE) ×2
GOWN STRL REUS W/ TWL LRG LVL3 (GOWN DISPOSABLE) ×2 IMPLANT
GOWN STRL REUS W/TWL LRG LVL3 (GOWN DISPOSABLE) ×6
LIQUID BAND (GAUZE/BANDAGES/DRESSINGS) IMPLANT
MATRIX TISSUE MESHED 4X5 (Tissue) ×2 IMPLANT
NDL HYPO 25X1 1.5 SAFETY (NEEDLE) IMPLANT
NEEDLE HYPO 25X1 1.5 SAFETY (NEEDLE) IMPLANT
NS IRRIG 1000ML POUR BTL (IV SOLUTION) IMPLANT
PACK BASIN DAY SURGERY FS (CUSTOM PROCEDURE TRAY) ×3 IMPLANT
PAD DRESSING TELFA 3X8 NADH (GAUZE/BANDAGES/DRESSINGS) IMPLANT
PENCIL BUTTON HOLSTER BLD 10FT (ELECTRODE) ×3 IMPLANT
PIN SAFETY STERILE (MISCELLANEOUS) IMPLANT
SLEEVE SCD COMPRESS KNEE MED (MISCELLANEOUS) ×2 IMPLANT
SPONGE LAP 18X18 X RAY DECT (DISPOSABLE) ×3 IMPLANT
SUT CHROMIC 4 0 PS 2 18 (SUTURE) ×4 IMPLANT
SUT ETHILON 2 0 FS 18 (SUTURE) IMPLANT
SUT MNCRL AB 4-0 PS2 18 (SUTURE) IMPLANT
SUT MON AB 3-0 SH 27 (SUTURE) ×3
SUT MON AB 3-0 SH27 (SUTURE) IMPLANT
SUT VIC AB 3-0 FS2 27 (SUTURE) IMPLANT
SUT VICRYL 4-0 PS2 18IN ABS (SUTURE) ×3 IMPLANT
SWAB COLLECTION DEVICE MRSA (MISCELLANEOUS) IMPLANT
SWAB CULTURE ESWAB REG 1ML (MISCELLANEOUS) IMPLANT
SYR BULB IRRIGATION 50ML (SYRINGE) ×1 IMPLANT
SYR CONTROL 10ML LL (SYRINGE) IMPLANT
TOWEL OR 17X24 6PK STRL BLUE (TOWEL DISPOSABLE) ×6 IMPLANT
TUBE CONNECTING 20'X1/4 (TUBING) ×1
TUBE CONNECTING 20X1/4 (TUBING) ×2 IMPLANT
UNDERPAD 30X30 (UNDERPADS AND DIAPERS) ×4 IMPLANT
YANKAUER SUCT BULB TIP NO VENT (SUCTIONS) ×3 IMPLANT

## 2017-01-25 NOTE — Interval H&P Note (Signed)
History and Physical Interval Note:  01/25/2017 12:53 PM  John Kelley  has presented today for surgery, with the diagnosis of open wound to left foot  The various methods of treatment have been discussed with the patient and family. After consideration of risks, benefits and other options for treatment, the patient has consented to  Procedure(s): DEBRIDEMENT OF LEFT FOOT WOUND WITH INTEGRA APPLICATION (Left) as a surgical intervention .  The patient's history has been reviewed, patient examined, no change in status, stable for surgery.  I have reviewed the patient's chart and labs.  Questions were answered to the patient's satisfaction.     Winson Eichorn

## 2017-01-25 NOTE — Op Note (Signed)
Operative Note   DATE OF OPERATION: 7.10.18  LOCATION: [facility]-outpatient  SURGICAL DIVISION: Plastic Surgery  PREOPERATIVE DIAGNOSES:  1. Crush injury left foot 2. Open metatarsal fractures left foot  POSTOPERATIVE DIAGNOSES:  same  PROCEDURE:  1. Excisional debridement skin, subcutaneous tissue left foot 13 x 8 cm 2. Application Integra dermal substrate 13 x 8 cm left foot 3. Simple closure left foot 4 cm  SURGEON: Glenna FellowsBrinda Domitila Stetler MD MBA  ASSISTANT: none  ANESTHESIA:  General.   EBL: 10 ml  COMPLICATIONS: None immediate.   INDICATIONS FOR PROCEDURE:  The patient, John Kelley, is a 26 y.o. male born on 29-Dec-1990, is here for debridement left foot. He is approximately 2 weeks post crush injury to left foot s/p percutaneous pinning multiple metatarsal fractures, compartment release. He presents with eschar of left foot and plan for debridement.   FINDINGS: Full thickness skin necrosis left foot dorsum 13 x 8 cm with necrosis subcutaneous fat and thrombosis multiple superficial vessels, hematoma surrounding MT bones. Following debridement, exposure of extensor tendons over dorsum foot which appeared viable.  DESCRIPTION OF PROCEDURE:  The patient's operative site was marked with the patient in the preoperative area. The patient was taken to the operating room. SCDs were placed and IV antibiotics were given. The patient's operative site was prepped and draped in a sterile fashion. A time out was performed and all information was confirmed to be correct. The medial compartment release incision was able to be closed primarily following excision of skin edges with interrupted horizontal mattress sutures of 3-0 monocryl, length 4 cm. Tangential excision of necrotic tissue performed with knife and scissors until bleeding tissue encountered. Skin and subcutaneous fat and thrombosed vessels excised. Hematoma noted and expressed from over metatarsals. Total area of debridement 13 x  8 cm. Wound irrigated with saline. Integra dermal substrate then placed over wound and inset with 4-0 chromic. Total 13 x 8 cm applied. Adaptic followed by VAC sponge bolster applied. VAC set to 125 mm Hg continuous. The pin sites dressed with xeroform followed by Kerlix and application posterior splint.  The patient was allowed to wake from anesthesia, extubated and taken to the recovery room in satisfactory condition.   SPECIMENS: none  DRAINS: none  Glenna FellowsBrinda Drelyn Pistilli, MD [facility] Plastic & Reconstructive Surgery [phone], pin [phone]

## 2017-01-25 NOTE — Anesthesia Procedure Notes (Signed)
Procedure Name: LMA Insertion Date/Time: 01/25/2017 1:28 PM Performed by: Zenia ResidesPAYNE, Niyanna Asch D Pre-anesthesia Checklist: Patient identified, Emergency Drugs available, Suction available and Patient being monitored Patient Re-evaluated:Patient Re-evaluated prior to inductionOxygen Delivery Method: Circle system utilized Preoxygenation: Pre-oxygenation with 100% oxygen Intubation Type: IV induction Ventilation: Mask ventilation without difficulty LMA: LMA inserted LMA Size: 4.0 Number of attempts: 1 Airway Equipment and Method: Bite block Placement Confirmation: positive ETCO2 Tube secured with: Tape Dental Injury: Teeth and Oropharynx as per pre-operative assessment

## 2017-01-25 NOTE — Transfer of Care (Signed)
Immediate Anesthesia Transfer of Care Note  Patient: John Kelley  Procedure(s) Performed: Procedure(s): DEBRIDEMENT OF LEFT FOOT WOUND WITH INTEGRA  APPLICATION (Left)  Patient Location: PACU  Anesthesia Type:General  Level of Consciousness: awake, alert  and drowsy  Airway & Oxygen Therapy: Patient Spontanous Breathing and Patient connected to face mask oxygen  Post-op Assessment: Report given to RN and Post -op Vital signs reviewed and stable  Post vital signs: Reviewed and stable  Last Vitals:  Vitals:   01/25/17 1050  BP: 123/77  Pulse: 78  Resp: 18  Temp: 36.8 C    Last Pain:  Vitals:   01/25/17 1050  TempSrc: Oral  PainSc: 4       Patients Stated Pain Goal: 0 (01/25/17 1050)  Complications: No apparent anesthesia complications

## 2017-01-25 NOTE — Anesthesia Preprocedure Evaluation (Signed)
Anesthesia Evaluation  Patient identified by MRN, date of birth, ID band Patient awake    Reviewed: Allergy & Precautions, NPO status , Patient's Chart, lab work & pertinent test results  Airway Mallampati: II  TM Distance: >3 FB Neck ROM: Full    Dental  (+) Dental Advisory Given   Pulmonary Current Smoker, former smoker,    breath sounds clear to auscultation       Cardiovascular negative cardio ROS   Rhythm:Regular Rate:Normal     Neuro/Psych negative neurological ROS     GI/Hepatic negative GI ROS, Neg liver ROS,   Endo/Other  negative endocrine ROS  Renal/GU negative Renal ROS     Musculoskeletal Open left foot fractures.   Abdominal   Peds  Hematology negative hematology ROS (+)   Anesthesia Other Findings   Reproductive/Obstetrics                             Lab Results  Component Value Date   WBC 11.9 (H) 01/08/2017   HGB 12.4 (L) 01/08/2017   HCT 37.0 (L) 01/08/2017   MCV 100.8 (H) 01/08/2017   PLT 194 01/08/2017   Lab Results  Component Value Date   CREATININE 0.88 01/08/2017   BUN 16 01/08/2017   NA 141 01/08/2017   K 4.0 01/08/2017   CL 110 01/08/2017   CO2 25 01/08/2017    Anesthesia Physical  Anesthesia Plan  ASA: II  Anesthesia Plan: General   Post-op Pain Management:    Induction: Intravenous and Rapid sequence  PONV Risk Score and Plan: 2 and Ondansetron and Dexamethasone  Airway Management Planned: LMA  Additional Equipment:   Intra-op Plan:   Post-operative Plan: Extubation in OR  Informed Consent: I have reviewed the patients History and Physical, chart, labs and discussed the procedure including the risks, benefits and alternatives for the proposed anesthesia with the patient or authorized representative who has indicated his/her understanding and acceptance.   Dental advisory given  Plan Discussed with: CRNA  Anesthesia Plan  Comments:         Anesthesia Quick Evaluation

## 2017-01-25 NOTE — Discharge Instructions (Signed)

## 2017-01-26 ENCOUNTER — Encounter (HOSPITAL_BASED_OUTPATIENT_CLINIC_OR_DEPARTMENT_OTHER): Payer: Self-pay | Admitting: Plastic Surgery

## 2017-01-26 NOTE — Anesthesia Postprocedure Evaluation (Signed)
Anesthesia Post Note  Patient: John Kelley  Procedure(s) Performed: Procedure(s) (LRB): DEBRIDEMENT OF LEFT FOOT WOUND WITH INTEGRA  APPLICATION (Left)     Patient location during evaluation: PACU Anesthesia Type: General Level of consciousness: awake and alert Pain management: pain level controlled Vital Signs Assessment: post-procedure vital signs reviewed and stable Respiratory status: spontaneous breathing, nonlabored ventilation and respiratory function stable Cardiovascular status: blood pressure returned to baseline and stable Postop Assessment: no signs of nausea or vomiting Anesthetic complications: no    Last Vitals:  Vitals:   01/25/17 1528 01/25/17 1553  BP:  132/81  Pulse: 91 (!) 102  Resp: 17 16  Temp:  36.7 C    Last Pain:  Vitals:   01/25/17 1553  TempSrc:   PainSc: 5                  Lowella CurbWarren Ray Miller

## 2017-02-16 DIAGNOSIS — S91302A Unspecified open wound, left foot, initial encounter: Secondary | ICD-10-CM

## 2017-02-16 HISTORY — DX: Unspecified open wound, left foot, initial encounter: S91.302A

## 2017-02-16 NOTE — H&P (Signed)
Subjective:     Patient ID: John Kelley is a 26 y.o. male.  Follow-up  3 weeks post op application Integra foot. Intra op noted full thickness skin necrosis with exposure extensor tendons. Seen by Dr. Victorino DikeHewitt- plan pin removal in 1 weeks and placed in boot for heel touch.  DOI 6.23.18 work related injury with forklift dropping on left foot. Injuries consistent with significant crush injury including soft tissue laceration over lateral foot and puncture injury heel, MT fractures 2-5 with comminution and dislocation 5th MT base, additional fracture cuboid. Underwent PP MT fractures, PP cuboid, release comparments and I&D soft tissue by Dr. Shon BatonBrooks, compartment release and laceration skin left open and VAC applied. Per chart, not candidate for ORIF due to soft tissue injury.   Review of Systems     Objective:   Physical Exam  Cardiovascular: Normal rate, regular rhythm and normal heart sounds.   Pulmonary/Chest: Effort normal and breath sounds normal.   LLE:  Pin sites clean no cellulitis, Integra adherent maturing without cellulitis, serous drainage, more edema foot than last exam     Assessment:     Crush injury left foot with open fracture history s/p PP MT2-5, cuboid S/p debridement, application Integra    Plan:     Wound care no changess, recommend daily dressing changes given amount drainage. Integra adherent.  Plan STSG. Discussed thigh donor site for this, use pig bladder matrix to aid with donor site healing. Discussed any problems with infection or exposure bone would refer to tertiary center for evaluation for free tissue transfer. Plan OP surgery and will resume VAC over STSG. Counseled patient greater edema today of foot, recommend keeping out of boot and keeping elevated as much as possible.   Glenna FellowsBrinda Nickole Adamek, MD [facility] Plastic & Reconstructive Surgery [phone], pin [phone]

## 2017-02-17 ENCOUNTER — Encounter (HOSPITAL_BASED_OUTPATIENT_CLINIC_OR_DEPARTMENT_OTHER): Payer: Self-pay | Admitting: *Deleted

## 2017-02-17 NOTE — Pre-Procedure Instructions (Signed)
To come pick up Boost Breeze - to drink by 0900 DOS

## 2017-02-18 ENCOUNTER — Ambulatory Visit (HOSPITAL_BASED_OUTPATIENT_CLINIC_OR_DEPARTMENT_OTHER)
Admission: RE | Admit: 2017-02-18 | Discharge: 2017-02-18 | Disposition: A | Discharge status: Home / Self Care | Payer: Worker's Compensation | Source: Ambulatory Visit | Attending: Plastic Surgery | Admitting: Plastic Surgery

## 2017-02-18 ENCOUNTER — Encounter (HOSPITAL_BASED_OUTPATIENT_CLINIC_OR_DEPARTMENT_OTHER): Payer: Self-pay

## 2017-02-18 ENCOUNTER — Encounter (HOSPITAL_BASED_OUTPATIENT_CLINIC_OR_DEPARTMENT_OTHER)
Admission: RE | Disposition: A | Discharge status: Home / Self Care | Payer: Self-pay | Source: Ambulatory Visit | Attending: Plastic Surgery

## 2017-02-18 ENCOUNTER — Ambulatory Visit (HOSPITAL_BASED_OUTPATIENT_CLINIC_OR_DEPARTMENT_OTHER): Payer: Worker's Compensation | Admitting: Anesthesiology

## 2017-02-18 DIAGNOSIS — Y990 Civilian activity done for income or pay: Secondary | ICD-10-CM | POA: Diagnosis not present

## 2017-02-18 DIAGNOSIS — Z8781 Personal history of (healed) traumatic fracture: Secondary | ICD-10-CM | POA: Diagnosis not present

## 2017-02-18 DIAGNOSIS — Z87891 Personal history of nicotine dependence: Secondary | ICD-10-CM | POA: Insufficient documentation

## 2017-02-18 DIAGNOSIS — S9782XA Crushing injury of left foot, initial encounter: Secondary | ICD-10-CM | POA: Insufficient documentation

## 2017-02-18 DIAGNOSIS — T8189XA Other complications of procedures, not elsewhere classified, initial encounter: Secondary | ICD-10-CM | POA: Diagnosis not present

## 2017-02-18 DIAGNOSIS — W240XXA Contact with lifting devices, not elsewhere classified, initial encounter: Secondary | ICD-10-CM | POA: Insufficient documentation

## 2017-02-18 DIAGNOSIS — Y838 Other surgical procedures as the cause of abnormal reaction of the patient, or of later complication, without mention of misadventure at the time of the procedure: Secondary | ICD-10-CM | POA: Diagnosis not present

## 2017-02-18 HISTORY — DX: Unspecified open wound, left foot, initial encounter: S91.302A

## 2017-02-18 HISTORY — PX: IRRIGATION AND DEBRIDEMENT OF WOUND WITH SPLIT THICKNESS SKIN GRAFT: SHX5879

## 2017-02-18 SURGERY — IRRIGATION AND DEBRIDEMENT OF WOUND WITH SPLIT THICKNESS SKIN GRAFT
Anesthesia: General | Site: Leg Lower | Laterality: Left

## 2017-02-18 MED ORDER — HYDROMORPHONE HCL 1 MG/ML IJ SOLN
INTRAMUSCULAR | Status: AC
  Filled 2017-02-18: qty 0.5

## 2017-02-18 MED ORDER — PROPOFOL 10 MG/ML IV BOLUS
INTRAVENOUS | Status: AC
  Filled 2017-02-18: qty 20

## 2017-02-18 MED ORDER — CEFAZOLIN SODIUM-DEXTROSE 2-4 GM/100ML-% IV SOLN
INTRAVENOUS | Status: AC
  Filled 2017-02-18: qty 100

## 2017-02-18 MED ORDER — FENTANYL CITRATE (PF) 100 MCG/2ML IJ SOLN
50.0000 ug | INTRAMUSCULAR | Status: DC | PRN
  Administered 2017-02-18: 25 ug via INTRAVENOUS
  Administered 2017-02-18: 100 ug via INTRAVENOUS

## 2017-02-18 MED ORDER — CEFAZOLIN SODIUM-DEXTROSE 2-4 GM/100ML-% IV SOLN
2.0000 g | INTRAVENOUS | Status: AC
  Administered 2017-02-18: 2 g via INTRAVENOUS

## 2017-02-18 MED ORDER — FENTANYL CITRATE (PF) 100 MCG/2ML IJ SOLN
INTRAMUSCULAR | Status: AC
  Filled 2017-02-18: qty 2

## 2017-02-18 MED ORDER — MIDAZOLAM HCL 2 MG/2ML IJ SOLN
INTRAMUSCULAR | Status: AC
  Filled 2017-02-18: qty 2

## 2017-02-18 MED ORDER — HYDROMORPHONE HCL 1 MG/ML IJ SOLN
0.2500 mg | INTRAMUSCULAR | Status: DC | PRN
Start: 2017-02-18 — End: 2017-02-18
  Administered 2017-02-18 (×2): 0.5 mg via INTRAVENOUS

## 2017-02-18 MED ORDER — LIDOCAINE 2% (20 MG/ML) 5 ML SYRINGE
INTRAMUSCULAR | Status: AC
Start: 2017-02-18 — End: 2017-02-18
  Filled 2017-02-18: qty 5

## 2017-02-18 MED ORDER — MINERAL OIL LIGHT 100 % EX OIL
TOPICAL_OIL | CUTANEOUS | Status: AC
Start: 2017-02-18 — End: 2017-02-18
  Filled 2017-02-18: qty 25

## 2017-02-18 MED ORDER — HYDROCODONE-ACETAMINOPHEN 5-325 MG PO TABS: 1.0000 | ORAL_TABLET | ORAL | 0 refills | Status: AC | PRN

## 2017-02-18 MED ORDER — SCOPOLAMINE 1 MG/3DAYS TD PT72: 1.0000 | MEDICATED_PATCH | Freq: Once | TRANSDERMAL | Status: DC | PRN

## 2017-02-18 MED ORDER — LIDOCAINE 2% (20 MG/ML) 5 ML SYRINGE
INTRAMUSCULAR | Status: DC | PRN
  Administered 2017-02-18: 40 mg via INTRAVENOUS

## 2017-02-18 MED ORDER — PHENYLEPHRINE HCL 10 MG/ML IJ SOLN
INTRAMUSCULAR | Status: DC | PRN
  Administered 2017-02-18: 80 ug via INTRAVENOUS

## 2017-02-18 MED ORDER — MINERAL OIL LIGHT 100 % EX OIL
TOPICAL_OIL | CUTANEOUS | Status: DC | PRN
  Administered 2017-02-18: 1 via TOPICAL

## 2017-02-18 MED ORDER — MIDAZOLAM HCL 2 MG/2ML IJ SOLN
1.0000 mg | INTRAMUSCULAR | Status: DC | PRN
  Administered 2017-02-18: 2 mg via INTRAVENOUS

## 2017-02-18 MED ORDER — PROPOFOL 10 MG/ML IV BOLUS
INTRAVENOUS | Status: DC | PRN
  Administered 2017-02-18: 180 mg via INTRAVENOUS

## 2017-02-18 MED ORDER — SUCCINYLCHOLINE CHLORIDE 200 MG/10ML IV SOSY
PREFILLED_SYRINGE | INTRAVENOUS | Status: AC
  Filled 2017-02-18: qty 10

## 2017-02-18 MED ORDER — ONDANSETRON HCL 4 MG/2ML IJ SOLN
INTRAMUSCULAR | Status: DC | PRN
  Administered 2017-02-18: 4 mg via INTRAVENOUS

## 2017-02-18 MED ORDER — LACTATED RINGERS IV SOLN
INTRAVENOUS | Status: DC
  Administered 2017-02-18 (×2): via INTRAVENOUS

## 2017-02-18 SURGICAL SUPPLY — 87 items
ADH SKN CLS APL DERMABOND .7 (GAUZE/BANDAGES/DRESSINGS)
APL SKNCLS STERI-STRIP NONHPOA (GAUZE/BANDAGES/DRESSINGS) ×4
BAG DECANTER FOR FLEXI CONT (MISCELLANEOUS) IMPLANT
BALL CTTN LRG ABS STRL LF (GAUZE/BANDAGES/DRESSINGS)
BANDAGE ACE 3X5.8 VEL STRL LF (GAUZE/BANDAGES/DRESSINGS) IMPLANT
BANDAGE ACE 4X5 VEL STRL LF (GAUZE/BANDAGES/DRESSINGS) IMPLANT
BANDAGE ACE 6X5 VEL STRL LF (GAUZE/BANDAGES/DRESSINGS) ×2 IMPLANT
BENZOIN TINCTURE PRP APPL 2/3 (GAUZE/BANDAGES/DRESSINGS) ×4 IMPLANT
BLADE CLIPPER SURG (BLADE) ×2 IMPLANT
BLADE DERMATOME SS (BLADE) ×2 IMPLANT
BLADE SURG 10 STRL SS (BLADE) IMPLANT
BLADE SURG 15 STRL LF DISP TIS (BLADE) ×2 IMPLANT
BLADE SURG 15 STRL SS (BLADE) ×3
BNDG COHESIVE 4X5 TAN STRL (GAUZE/BANDAGES/DRESSINGS) ×3 IMPLANT
BNDG GAUZE ELAST 4 BULKY (GAUZE/BANDAGES/DRESSINGS) ×3 IMPLANT
CANISTER SUCT 1200ML W/VALVE (MISCELLANEOUS) ×2 IMPLANT
CHLORAPREP W/TINT 26ML (MISCELLANEOUS) IMPLANT
COTTONBALL LRG STERILE PKG (GAUZE/BANDAGES/DRESSINGS) IMPLANT
COVER BACK TABLE 60X90IN (DRAPES) ×3 IMPLANT
COVER MAYO STAND STRL (DRAPES) ×3 IMPLANT
DECANTER SPIKE VIAL GLASS SM (MISCELLANEOUS) IMPLANT
DERMABOND ADVANCED (GAUZE/BANDAGES/DRESSINGS)
DERMABOND ADVANCED .7 DNX12 (GAUZE/BANDAGES/DRESSINGS) IMPLANT
DERMACARRIERS GRAFT 1 TO 1.5 (DISPOSABLE) ×3
DRAPE INCISE IOBAN 66X45 STRL (DRAPES) IMPLANT
DRAPE LAPAROTOMY 100X72 PEDS (DRAPES) IMPLANT
DRAPE SURG 17X23 STRL (DRAPES) IMPLANT
DRAPE U-SHAPE 76X120 STRL (DRAPES) ×3 IMPLANT
DRSG ADAPTIC 3X8 NADH LF (GAUZE/BANDAGES/DRESSINGS) ×2 IMPLANT
DRSG EMULSION OIL 3X3 NADH (GAUZE/BANDAGES/DRESSINGS) IMPLANT
DRSG PAD ABDOMINAL 8X10 ST (GAUZE/BANDAGES/DRESSINGS) ×3 IMPLANT
ELECT COATED BLADE 2.86 ST (ELECTRODE) IMPLANT
ELECT NDL BLADE 2-5/6 (NEEDLE) IMPLANT
ELECT NEEDLE BLADE 2-5/6 (NEEDLE) IMPLANT
ELECT REM PT RETURN 9FT ADLT (ELECTROSURGICAL)
ELECTRODE REM PT RTRN 9FT ADLT (ELECTROSURGICAL) IMPLANT
GAUZE SPONGE 4X4 12PLY STRL (GAUZE/BANDAGES/DRESSINGS) ×3 IMPLANT
GAUZE SPONGE 4X4 12PLY STRL LF (GAUZE/BANDAGES/DRESSINGS) IMPLANT
GAUZE XEROFORM 1X8 LF (GAUZE/BANDAGES/DRESSINGS) ×2 IMPLANT
GAUZE XEROFORM 5X9 LF (GAUZE/BANDAGES/DRESSINGS) ×2 IMPLANT
GLOVE BIO SURGEON STRL SZ 6 (GLOVE) ×3 IMPLANT
GLOVE BIO SURGEON STRL SZ 6.5 (GLOVE) IMPLANT
GOWN STRL REUS W/ TWL LRG LVL3 (GOWN DISPOSABLE) ×4 IMPLANT
GOWN STRL REUS W/TWL LRG LVL3 (GOWN DISPOSABLE) ×6
GRAFT DERMACARRIERS 1 TO 1.5 (DISPOSABLE) ×1 IMPLANT
HYDROGEN PEROXIDE 16OZ (MISCELLANEOUS) ×3 IMPLANT
NDL HYPO 25X1 1.5 SAFETY (NEEDLE) ×1 IMPLANT
NDL PRECISIONGLIDE 27X1.5 (NEEDLE) IMPLANT
NEEDLE HYPO 25X1 1.5 SAFETY (NEEDLE) ×3 IMPLANT
NEEDLE PRECISIONGLIDE 27X1.5 (NEEDLE) IMPLANT
NS IRRIG 1000ML POUR BTL (IV SOLUTION) ×3 IMPLANT
PACK BASIN DAY SURGERY FS (CUSTOM PROCEDURE TRAY) ×3 IMPLANT
PAD CAST 3X4 CTTN HI CHSV (CAST SUPPLIES) IMPLANT
PAD CAST 4YDX4 CTTN HI CHSV (CAST SUPPLIES) IMPLANT
PADDING CAST COTTON 3X4 STRL (CAST SUPPLIES)
PADDING CAST COTTON 4X4 STRL (CAST SUPPLIES)
PENCIL BUTTON HOLSTER BLD 10FT (ELECTRODE) IMPLANT
SHEET MEDIUM DRAPE 40X70 STRL (DRAPES) IMPLANT
SLEEVE SCD COMPRESS KNEE MED (MISCELLANEOUS) IMPLANT
SPONGE LAP 18X18 X RAY DECT (DISPOSABLE) ×3 IMPLANT
STAPLER VISISTAT 35W (STAPLE) ×3 IMPLANT
STOCKINETTE 4X48 STRL (DRAPES) IMPLANT
STOCKINETTE 6  STRL (DRAPES)
STOCKINETTE 6 STRL (DRAPES) IMPLANT
STOCKINETTE IMPERVIOUS LG (DRAPES) IMPLANT
STRIP CLOSURE SKIN 1/2X4 (GAUZE/BANDAGES/DRESSINGS) IMPLANT
SUCTION FRAZIER HANDLE 10FR (MISCELLANEOUS)
SUCTION TUBE FRAZIER 10FR DISP (MISCELLANEOUS) IMPLANT
SURGILUBE 2OZ TUBE FLIPTOP (MISCELLANEOUS) IMPLANT
SUT CHROMIC 4 0 PS 2 18 (SUTURE) ×8 IMPLANT
SUT CHROMIC 5 0 P 3 (SUTURE) IMPLANT
SUT MNCRL AB 4-0 PS2 18 (SUTURE) ×3 IMPLANT
SUT SILK 3 0 SH CR/8 (SUTURE) IMPLANT
SUT SILK 4 0 PS 2 (SUTURE) IMPLANT
SUT VIC AB 3-0 FS2 27 (SUTURE) IMPLANT
SUT VIC AB 5-0 P-3 18X BRD (SUTURE) IMPLANT
SUT VIC AB 5-0 P3 18 (SUTURE)
SUT VIC AB 5-0 PS2 18 (SUTURE) IMPLANT
SUT VICRYL 4-0 PS2 18IN ABS (SUTURE) IMPLANT
SYR BULB 3OZ (MISCELLANEOUS) IMPLANT
SYR BULB IRRIGATION 50ML (SYRINGE) IMPLANT
SYR CONTROL 10ML LL (SYRINGE) ×3 IMPLANT
TOWEL OR 17X24 6PK STRL BLUE (TOWEL DISPOSABLE) ×3 IMPLANT
TRAY DSU PREP LF (CUSTOM PROCEDURE TRAY) ×3 IMPLANT
TUBE CONNECTING 20X1/4 (TUBING) IMPLANT
UNDERPAD 30X30 (UNDERPADS AND DIAPERS) ×3 IMPLANT
YANKAUER SUCT BULB TIP NO VENT (SUCTIONS) IMPLANT

## 2017-02-18 NOTE — Transfer of Care (Signed)
Immediate Anesthesia Transfer of Care Note  Patient: John Kelley  Procedure(s) Performed: Procedure(s): SPLIT THICKNESS SKIN GRAFT FROM LEFT THIGH TO LEFT FOOT, (Left)  Patient Location: PACU  Anesthesia Type:General  Level of Consciousness: awake and patient cooperative  Airway & Oxygen Therapy: Patient Spontanous Breathing and Patient connected to face mask oxygen  Post-op Assessment: Report given to RN and Post -op Vital signs reviewed and stable  Post vital signs: Reviewed and stable  Last Vitals:  Vitals:   02/18/17 1116  BP: 133/72  Pulse: 85  Resp: 18  Temp: 36.6 C    Last Pain:  Vitals:   02/18/17 1116  TempSrc: Oral         Complications: No apparent anesthesia complications

## 2017-02-18 NOTE — Discharge Instructions (Signed)

## 2017-02-18 NOTE — Progress Notes (Signed)
Boost drink given with instructions to drink now. Pt verbalized understanding and will return for scheduled surgery at appropriate time.

## 2017-02-18 NOTE — Interval H&P Note (Signed)
History and Physical Interval Note:  02/18/2017 11:01 AM  John Kelley  has presented today for surgery, with the diagnosis of OPEN WOUND OF LEFT FOOT  The various methods of treatment have been discussed with the patient and family. After consideration of risks, benefits and other options for treatment, the patient has consented to  Procedure(s): SPLIT THICKNESS SKIN GRAFT FROM LEFT THIGH TO LEFT FOOT, (Left) APPLICATION OF A-CELL TO LEFT THIGH (Left) as a surgical intervention .  The patient's history has been reviewed, patient examined, no change in status, stable for surgery.  I have reviewed the patient's chart and labs.  Questions were answered to the patient's satisfaction.     Nadalee Neiswender

## 2017-02-18 NOTE — Anesthesia Preprocedure Evaluation (Addendum)
Anesthesia Evaluation  Patient identified by MRN, date of birth, ID band Patient awake    Reviewed: Allergy & Precautions, H&P , NPO status , Patient's Chart, lab work & pertinent test results  Airway Mallampati: I  TM Distance: >3 FB Neck ROM: Full    Dental no notable dental hx. (+) Teeth Intact, Dental Advisory Given   Pulmonary neg pulmonary ROS, former smoker,    Pulmonary exam normal breath sounds clear to auscultation       Cardiovascular negative cardio ROS   Rhythm:Regular     Neuro/Psych negative neurological ROS  negative psych ROS   GI/Hepatic negative GI ROS, Neg liver ROS,   Endo/Other  negative endocrine ROS  Renal/GU negative Renal ROS  negative genitourinary   Musculoskeletal   Abdominal   Peds  Hematology negative hematology ROS (+)   Anesthesia Other Findings   Reproductive/Obstetrics negative OB ROS                            Anesthesia Physical Anesthesia Plan  ASA: I  Anesthesia Plan: General   Post-op Pain Management:    Induction: Intravenous  PONV Risk Score and Plan:   Airway Management Planned: LMA  Additional Equipment:   Intra-op Plan:   Post-operative Plan: Extubation in OR  Informed Consent: I have reviewed the patients History and Physical, chart, labs and discussed the procedure including the risks, benefits and alternatives for the proposed anesthesia with the patient or authorized representative who has indicated his/her understanding and acceptance.   Dental advisory given  Plan Discussed with: CRNA  Anesthesia Plan Comments:         Anesthesia Quick Evaluation

## 2017-02-18 NOTE — Op Note (Signed)
Operative Note   DATE OF OPERATION: 8.3.18  LOCATION: [facility]-outpatient  SURGICAL DIVISION: Plastic Surgery  PREOPERATIVE DIAGNOSES:  1. Crush injury left foot 2. Open wound left foot with exposure muscle tendons  POSTOPERATIVE DIAGNOSES:  same  PROCEDURE:  Split thickness skin graft from left thigh to left foot 15 x 9 cm  SURGEON: Glenna FellowsBrinda Ashleen Demma MD MBA  ASSISTANT: none  ANESTHESIA:  General.   EBL: 35 ml  COMPLICATIONS: None immediate.   INDICATIONS FOR PROCEDURE:  The patient, John Kelley, is a 26 y.o. male born on Dec 08, 1990, is here for skin graft to left foot. Patient has a history of severe crush injury to foot with open fracture and subsequent necrosis soft tissue. He is 3.5 weeks post operative from debridement soft tissue and application Integra.   FINDINGS: Integra incorporated over entirety wound with exception 2 x 1.5 cm area lateral proximal wound with tendon exposure. No exposure bone noted.   DESCRIPTION OF PROCEDURE:  The patient's operative site was marked with the patient in the preoperative area. The patient was taken to the operating room. SCDs were placed and IV antibiotics were given. The patient's operative site was prepped and draped in a sterile fashion. A time out was performed and all information was confirmed to be correct. Curettage performed over left foot wound to remove slough and any non incorporated Integra. Wound irrigated with saline. Split thickness skin graft harvested from left thigh at 10/1000th inch and meshed in 1:1.5 ratio. Total area of graft 15 x 9 cm. The skin graft was secured to wound with 4-0 chromic. Adaptic and VAC sponge applied as bolster. VAC set to 125 mm Hg continuous suction. The left thigh dressed with Xeroform over donor site followed by dry dressing, Ace wrap.  The patient was allowed to wake from anesthesia, extubated and taken to the recovery room in satisfactory condition.   SPECIMENS: none  DRAINS:  none  Glenna FellowsBrinda Chadd Tollison, MD [facility] Plastic & Reconstructive Surgery [phone], pin [phone]

## 2017-02-18 NOTE — Anesthesia Procedure Notes (Signed)
Procedure Name: LMA Insertion Date/Time: 02/18/2017 12:28 PM Performed by: Lucinda DellECARLO, Haddon Fyfe M Pre-anesthesia Checklist: Patient identified, Emergency Drugs available, Suction available and Patient being monitored Patient Re-evaluated:Patient Re-evaluated prior to induction Oxygen Delivery Method: Circle system utilized Preoxygenation: Pre-oxygenation with 100% oxygen Induction Type: IV induction LMA: LMA inserted LMA Size: 4.0 Tube type: Oral Number of attempts: 1 Placement Confirmation: positive ETCO2 and breath sounds checked- equal and bilateral Tube secured with: Tape Dental Injury: Teeth and Oropharynx as per pre-operative assessment

## 2017-02-19 NOTE — Anesthesia Postprocedure Evaluation (Signed)
Anesthesia Post Note  Patient: John Kelley  Procedure(s) Performed: Procedure(s) (LRB): SPLIT THICKNESS SKIN GRAFT FROM LEFT THIGH TO LEFT FOOT, (Left)     Patient location during evaluation: PACU Anesthesia Type: General Level of consciousness: awake, awake and alert and oriented Pain management: pain level controlled Vital Signs Assessment: post-procedure vital signs reviewed and stable Respiratory status: spontaneous breathing, nonlabored ventilation and respiratory function stable Cardiovascular status: blood pressure returned to baseline Anesthetic complications: no    Last Vitals:  Vitals:   02/18/17 1445 02/18/17 1519  BP: 116/66 120/73  Pulse: 87 77  Resp: (!) 21 20  Temp:  36.5 C    Last Pain:  Vitals:   02/18/17 1519  TempSrc:   PainSc: 3                  Brookes Craine COKER

## 2017-02-21 ENCOUNTER — Encounter (HOSPITAL_BASED_OUTPATIENT_CLINIC_OR_DEPARTMENT_OTHER): Payer: Self-pay | Admitting: Plastic Surgery

## 2018-10-18 IMAGING — CT CT FOOT*L* W/O CM
3 of 5 series · 11 of 34 positions shown, 13 images · non-contrast
Comparison: Radiographs dated 01/08/2017

CLINICAL DATA: Crush injury of the left foot with multiple open
fractures. The patient's foot was run over by a forklift.

EXAM:
CT OF THE LEFT FOOT WITHOUT CONTRAST
TECHNIQUE: Multidetector CT imaging of the left foot was performed according to
the standard protocol. Multiplanar CT image reconstructions were
also generated.

[Series 3: foot/ankle bone windows · axial · 0.52mm/px · z∈[-1417,-1325]mm · 3 of 92 slices shown, 4 images]
[im 23/92  soft-tissue]
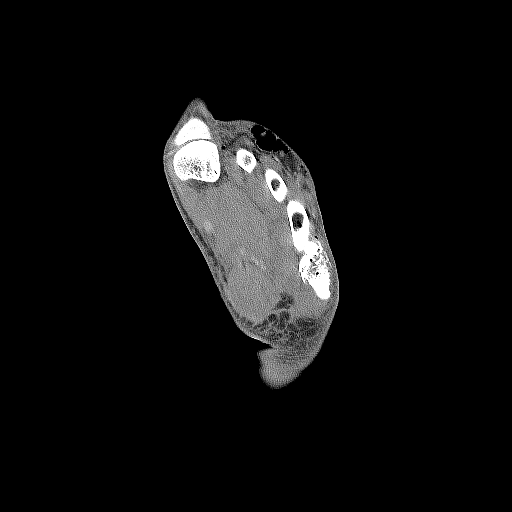
[im 23/92  bone]
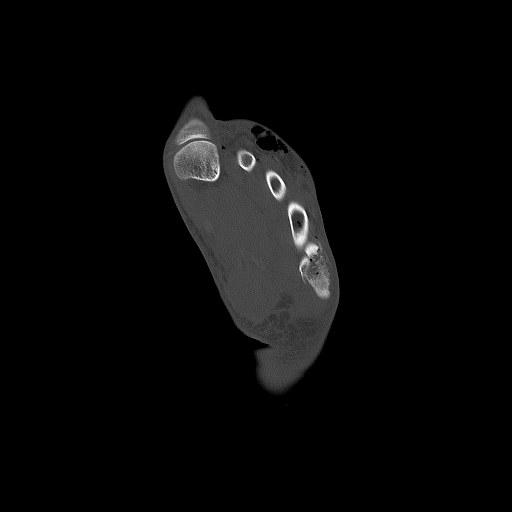
[im 46/92  bone]
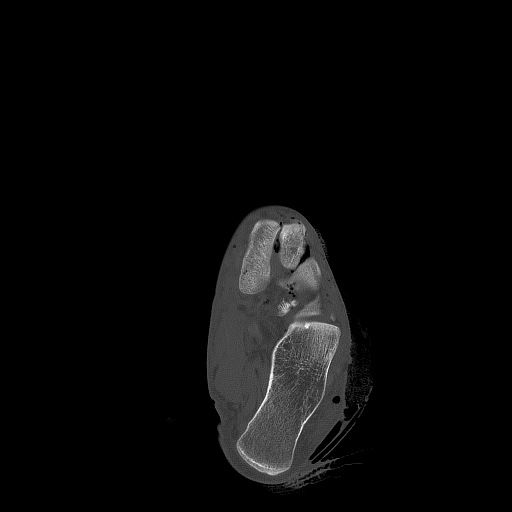
[im 69/92  bone]
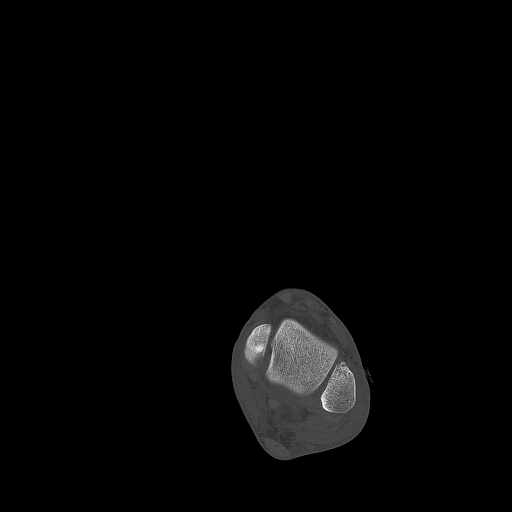

[Series 7: coronal bone · coronal · 0.24mm/px · 3 of 129 slices shown]
[im 47/129  bone]
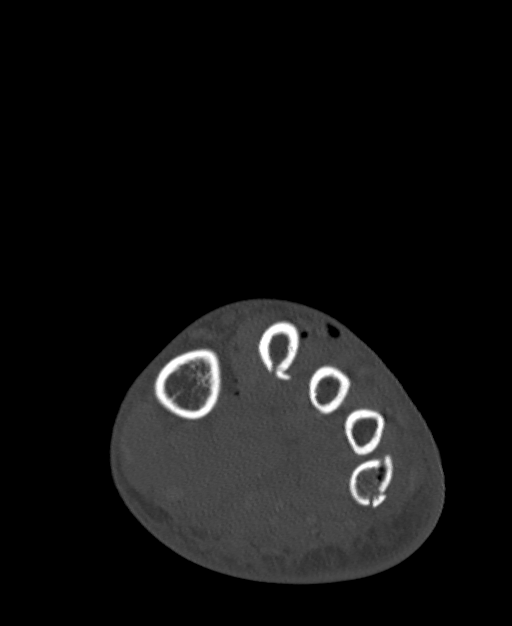
[im 59/129  bone]
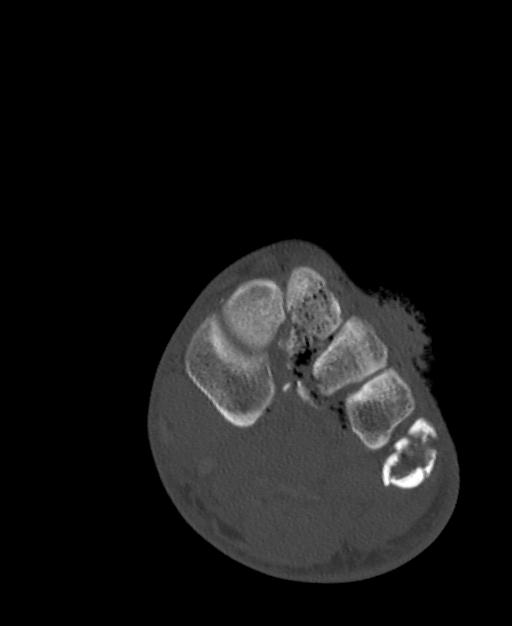
[im 70/129  bone]
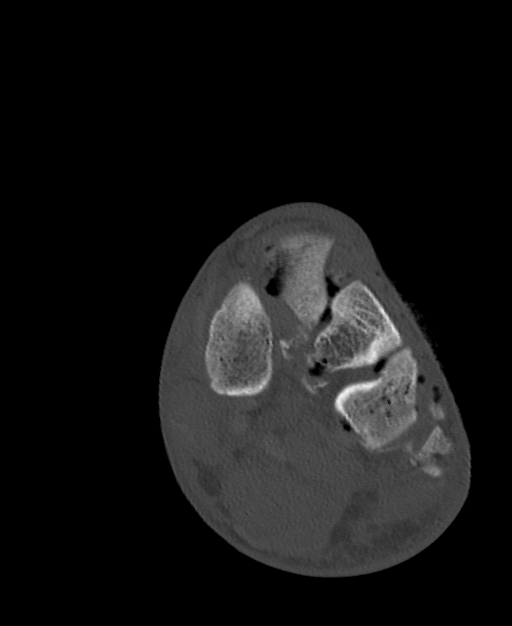

[Series 8: sagittal bone · sagittal · 0.28mm/px · 5 of 47 slices shown, 6 images]
[im 16/47  bone]
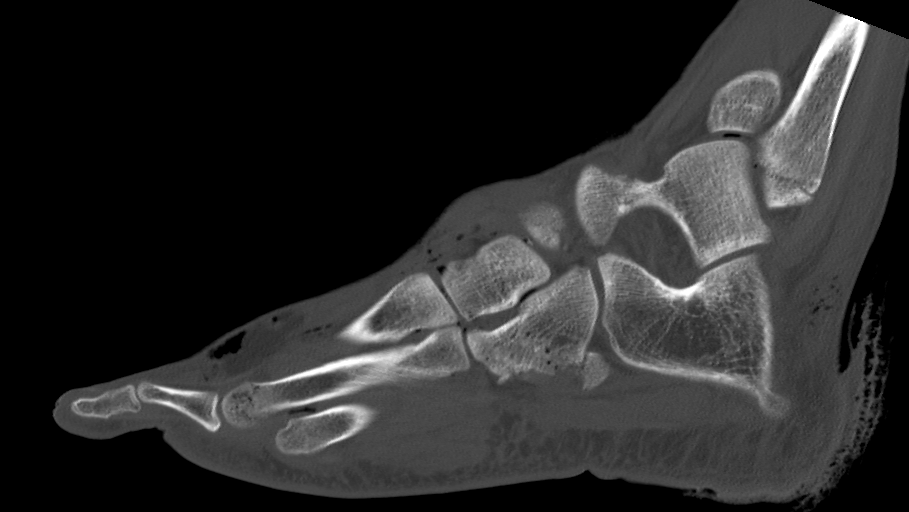
[im 20/47  bone]
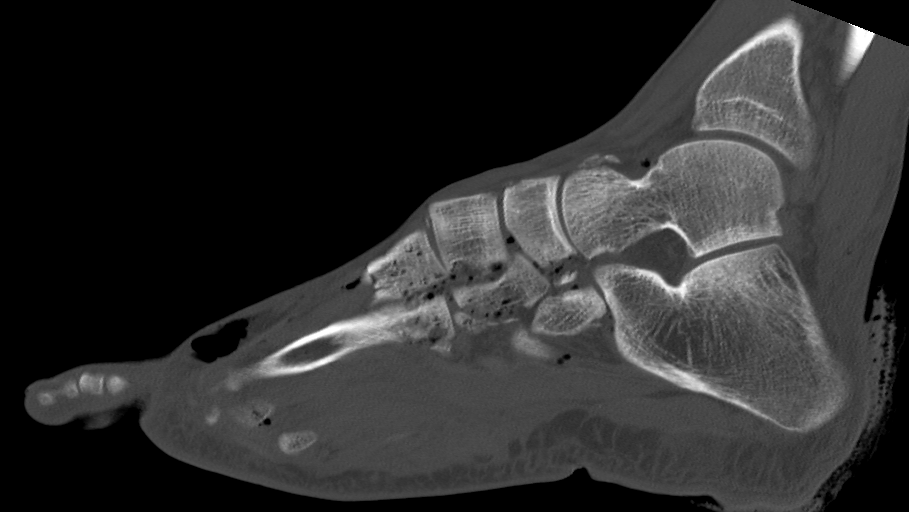
[im 24/47  soft-tissue]
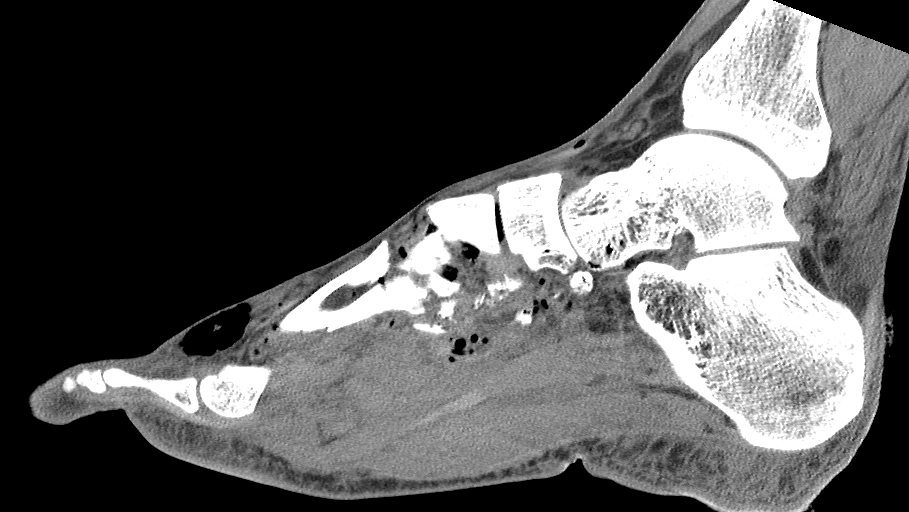
[im 24/47  bone]
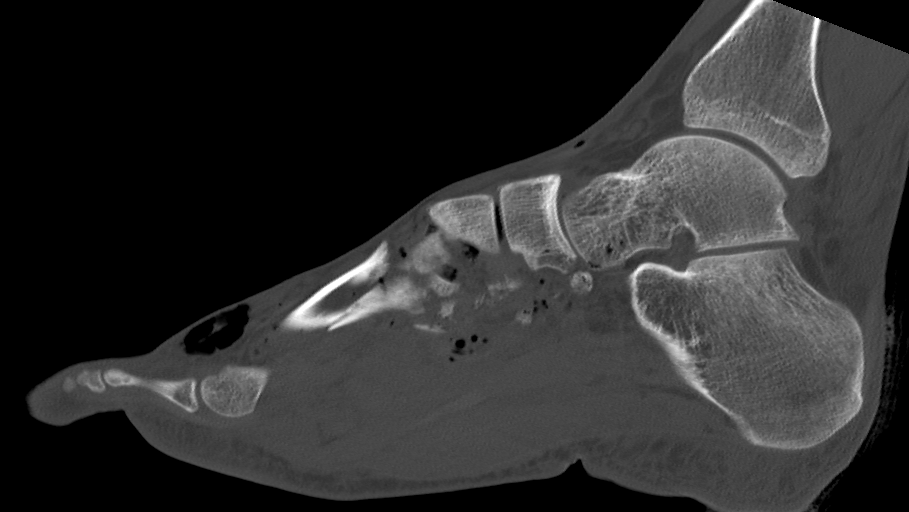
[im 27/47  bone]
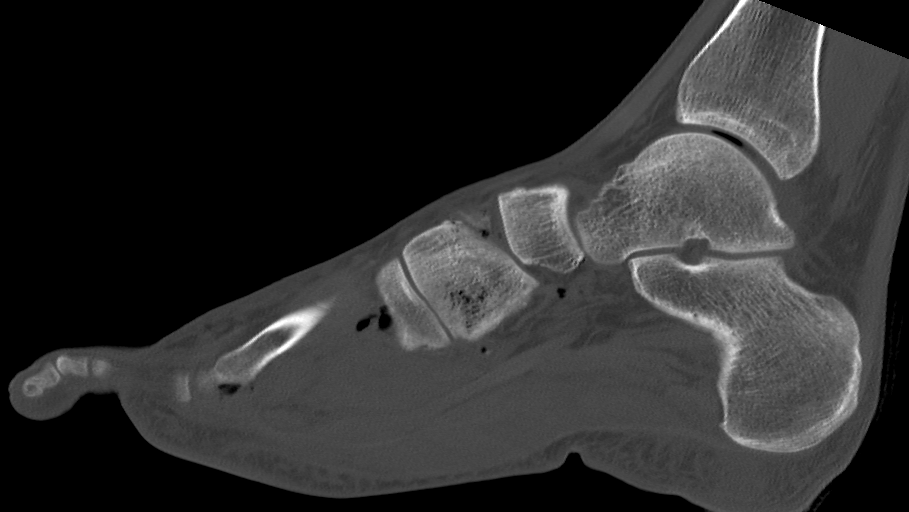
[im 31/47  bone]
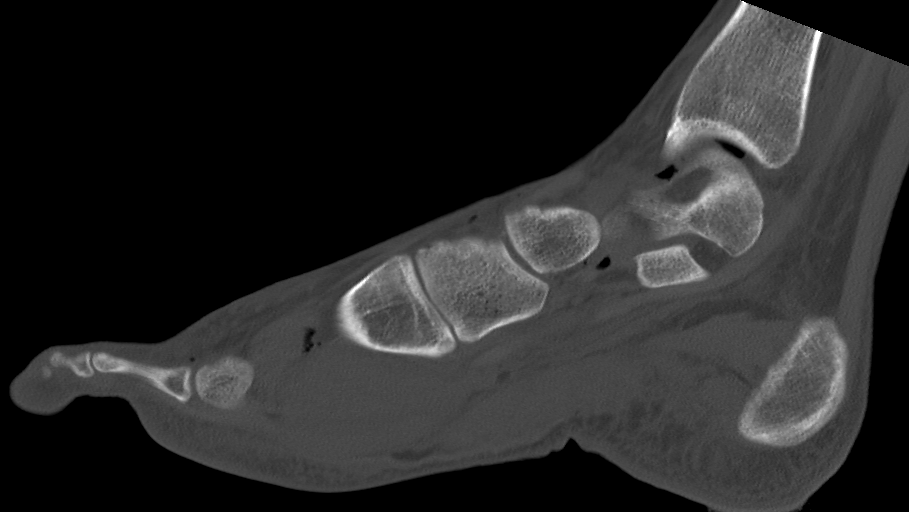

[11 of 34 positions shown; findings below may reference images not displayed]

FINDINGS: Bones/Joint/Cartilage

Distal fibula: There is a transverse fracture through the lateral
malleolus without displacement.

Talus: Small avulsions from the distal dorsal lateral aspect of the
talus.

Calcaneus: Small avulsion fractures lateral plantar aspect of the
distal calcaneus at the calcaneocuboid joint.

Navicular: There is a focal avulsion fracture of the medial plantar
aspect of the navicular.

Cuboid: There is a comminuted fracture of the lateral aspect of the
cuboid with gas in the adjacent soft tissues as well as within the
bone.

Cuneiforms: There is disruption of the plantar ligaments at the
cuneiforms. There small avulsions from the medial cuneiform with the
severely comminuted fracture of the plantar aspect of middle and
lateral cuneiforms. Extensive gas in the soft tissues.

Metatarsals: Severely comminuted fracture of the proximal fifth
metatarsal with spiral fracture extending throughout the shaft of
the fifth metatarsal. There is dislocation at fifth MTP joint.

There is a severely comminuted fracture of the base of second
metatarsal. There are small avulsions from plantar aspect of the
base of the third metatarsal. Fourth metatarsal appears to be
intact. First metatarsal is intact.

Soft tissues

There is extensive gas in the soft tissues of the foot particularly
dorsally. There is gas in some of the bones including the cuboid,
navicular, and talus

There is an abnormal fluid collection at the dorsal aspect distal
forefoot which probably represents a seroma, also containing air.
IMPRESSION: Extensive fractures of left foot as described.

## 2018-10-18 IMAGING — CR DG FOOT COMPLETE 3+V*L*
4 series · 4 of 4 positions shown · non-contrast
Comparison: None

CLINICAL DATA: Pt c/o pain to obv deformity, open wounds to
foot/ankle s/p crush inj today, extremity ran over by a forklift at
his job.

EXAM:
LEFT FOOT - COMPLETE 3+ VIEW

[x foot lat left (1 of 3)]
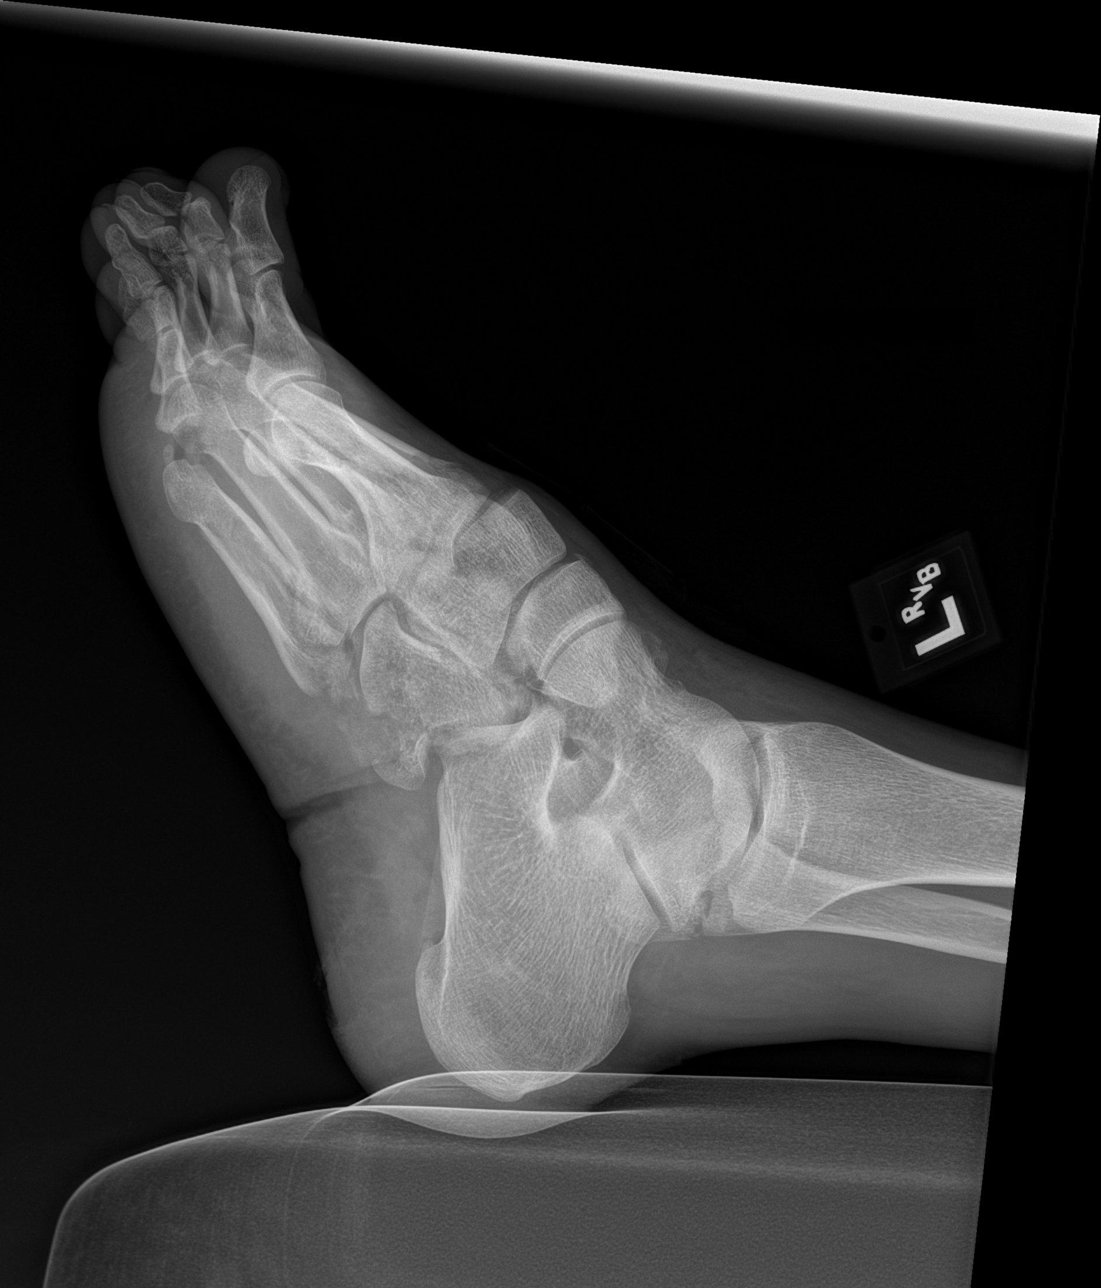

[x foot lat left (2 of 3)]
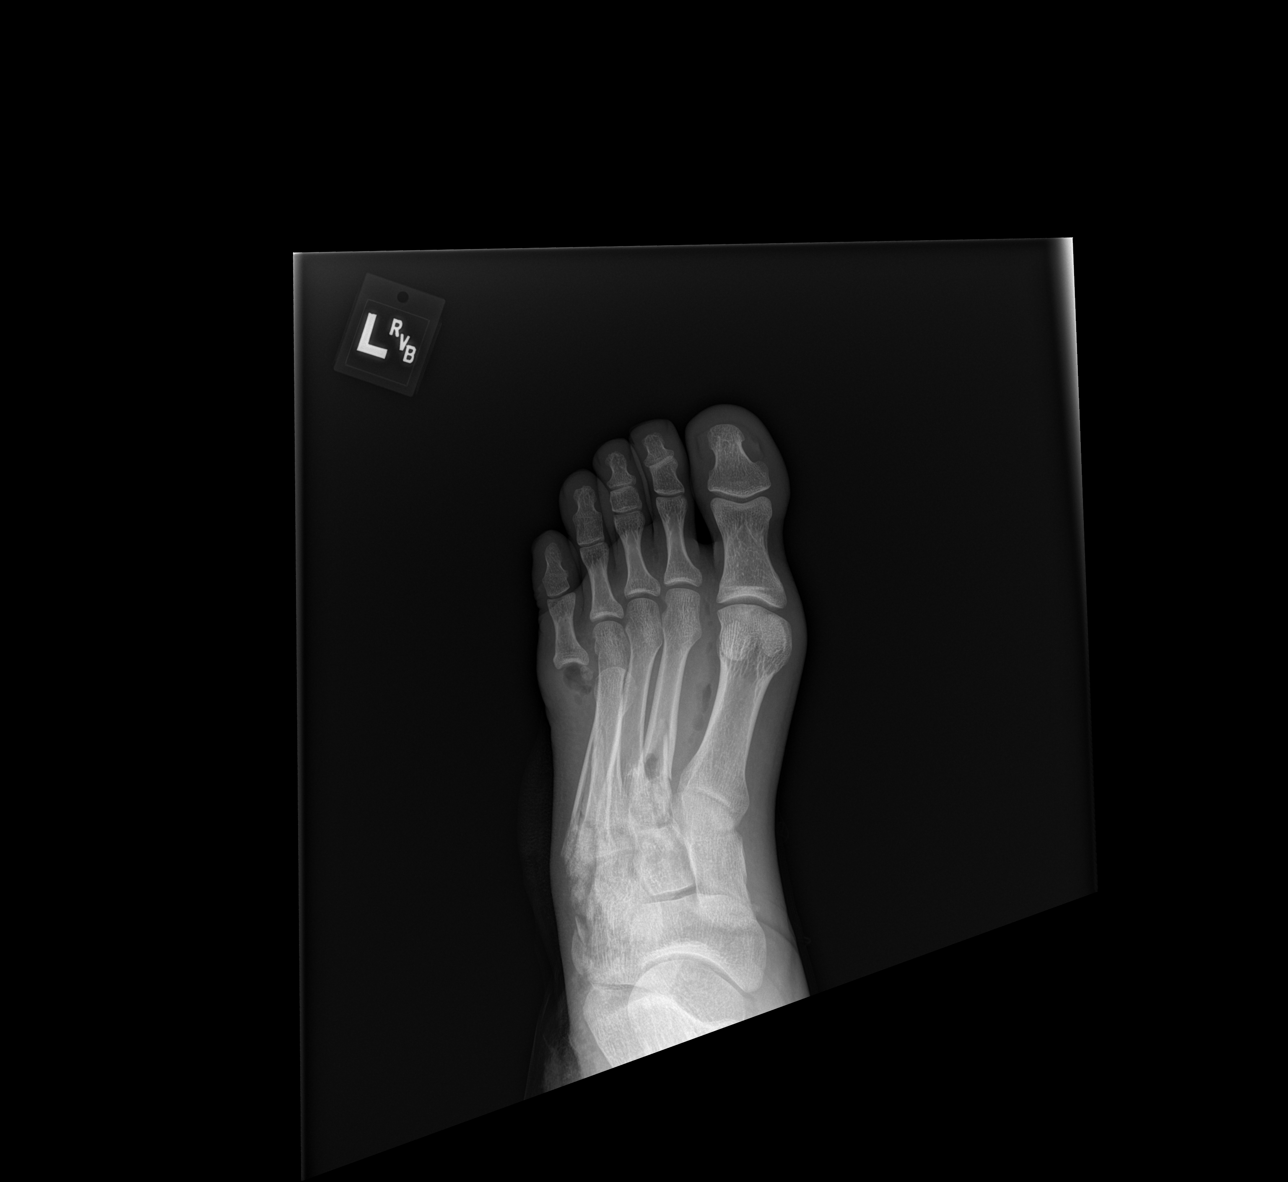

[x foot lat left (3 of 3)]
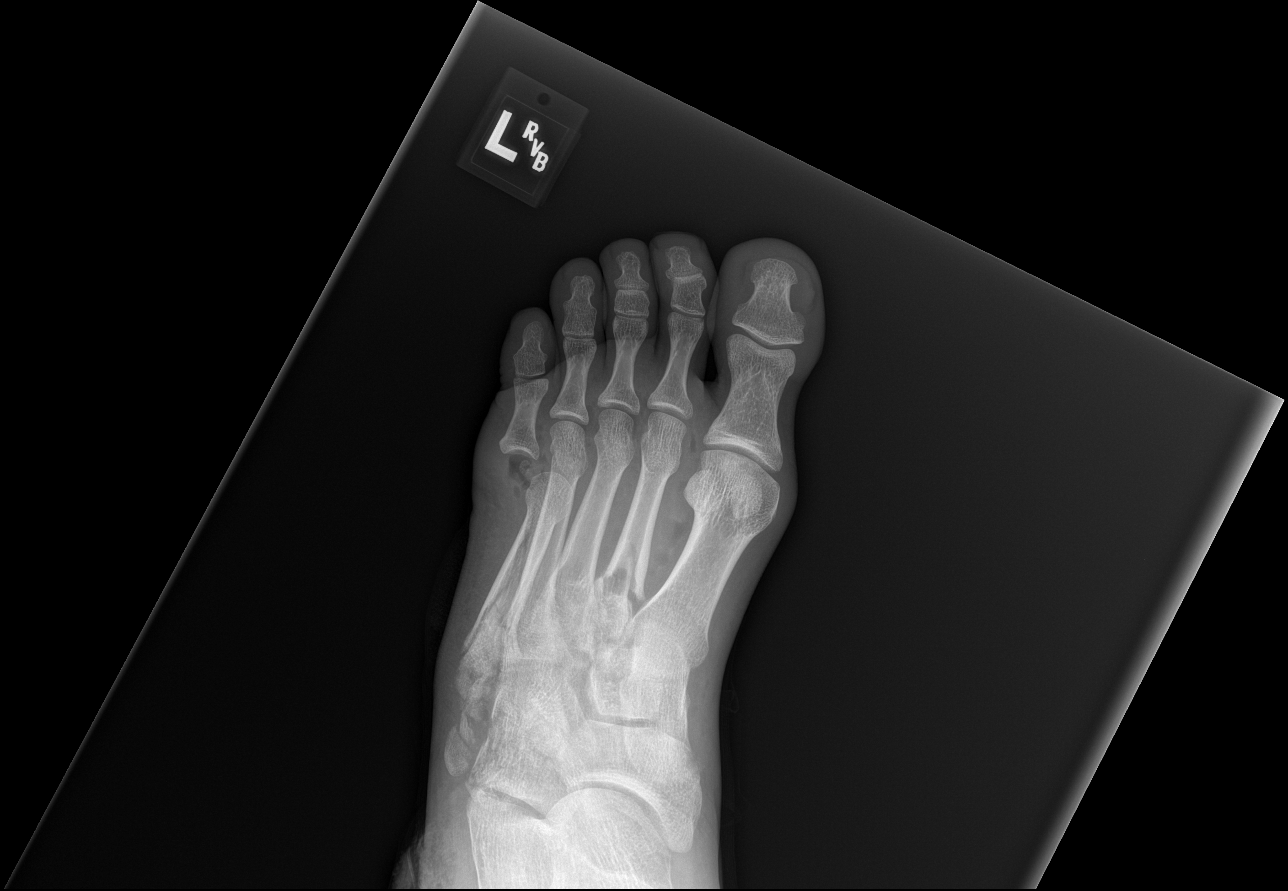

[x foot obl left]
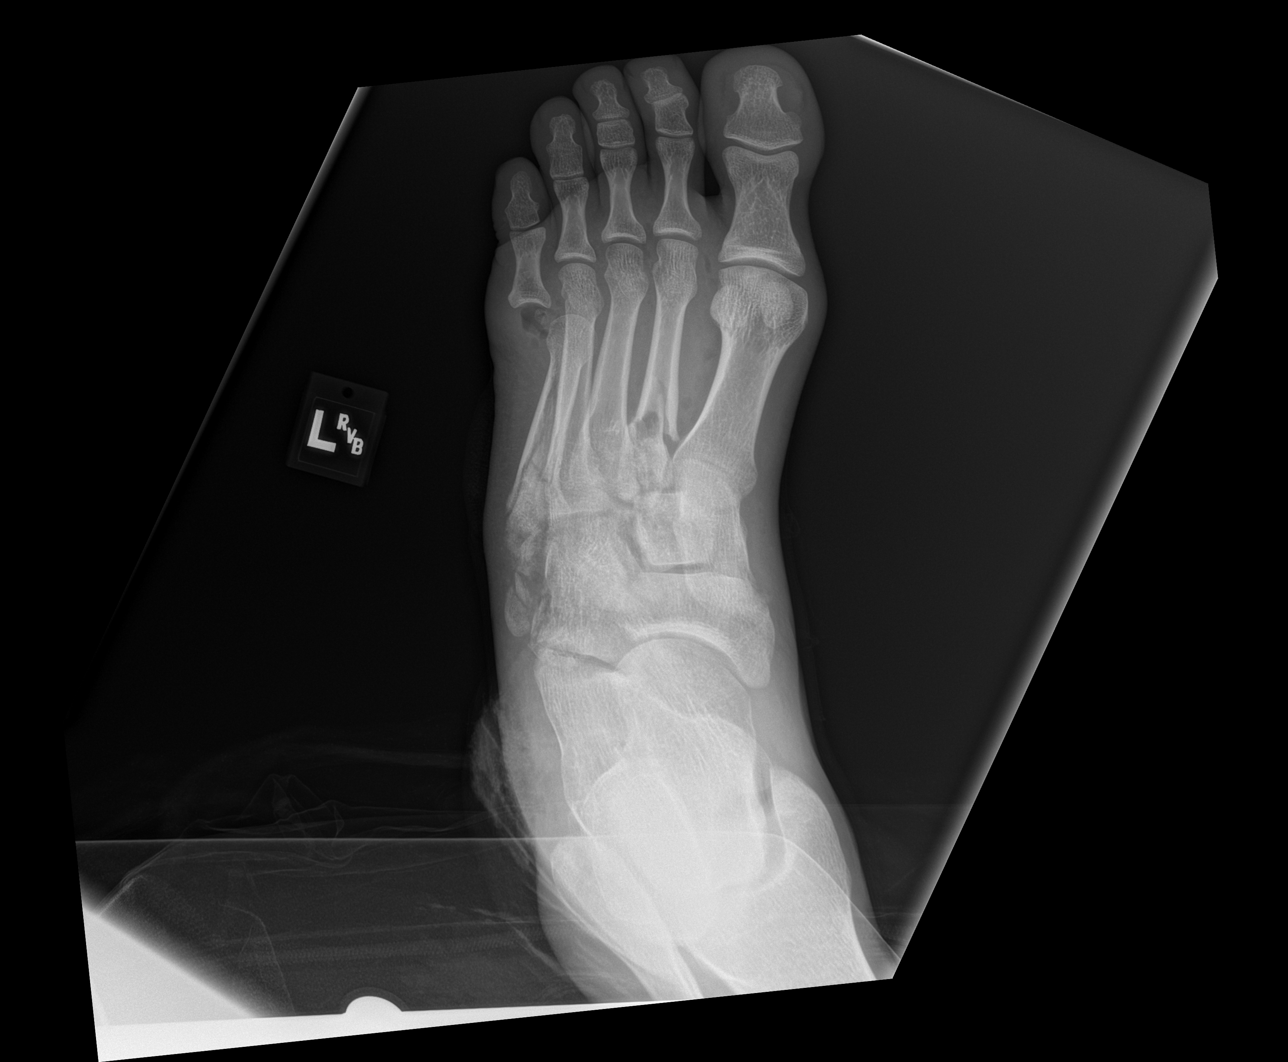

[4 of 4 positions shown; findings below may reference images not displayed]

FINDINGS: There is fractures of the proximal aspect of the second third fourth
and fifth metatarsals. Severe comminution of the base of the fifth
metatarsal.

The fifth metatarsal is dislocated at the metatarsal-phalangeal
joint. The metatarsal tarsal joint is poorly evaluated and likely
disrupted.

On lateral projection the cuboid bone is fractured. Calcaneus
appears normal.
IMPRESSION: 1. Comminuted fractures of the base of the second, third, fourth and
fifth metatarsals.
2. Fifth metatarsal base severely comminuted. Fifth metatarsal
phalangeal joint dislocation.
3. Fracture of the cuboid bone.
4. Disruption of the tarsal metatarsal joints. Concern for
additional tarsal fractures. Recommend CT of the foot.

## 2019-05-08 ENCOUNTER — Emergency Department (HOSPITAL_COMMUNITY): Payer: Managed Care, Other (non HMO) | Attending: Emergency Medicine

## 2019-05-08 ENCOUNTER — Inpatient Hospital Stay (HOSPITAL_COMMUNITY)
Admission: EM | Admit: 2019-05-08 | Discharge: 2019-05-10 | DRG: 494 | Disposition: A | Discharge status: Home / Self Care | Payer: No Typology Code available for payment source | Attending: Orthopaedic Surgery | Admitting: Orthopaedic Surgery

## 2019-05-08 ENCOUNTER — Emergency Department (HOSPITAL_COMMUNITY): Payer: Managed Care, Other (non HMO)

## 2019-05-08 ENCOUNTER — Other Ambulatory Visit: Payer: Self-pay

## 2019-05-08 ENCOUNTER — Encounter (HOSPITAL_COMMUNITY): Payer: Self-pay | Admitting: Emergency Medicine

## 2019-05-08 DIAGNOSIS — W230XXA Caught, crushed, jammed, or pinched between moving objects, initial encounter: Secondary | ICD-10-CM | POA: Diagnosis present

## 2019-05-08 DIAGNOSIS — Z87891 Personal history of nicotine dependence: Secondary | ICD-10-CM | POA: Diagnosis not present

## 2019-05-08 DIAGNOSIS — Z20828 Contact with and (suspected) exposure to other viral communicable diseases: Secondary | ICD-10-CM | POA: Diagnosis present

## 2019-05-08 DIAGNOSIS — Z09 Encounter for follow-up examination after completed treatment for conditions other than malignant neoplasm: Secondary | ICD-10-CM

## 2019-05-08 DIAGNOSIS — S82254A Nondisplaced comminuted fracture of shaft of right tibia, initial encounter for closed fracture: Secondary | ICD-10-CM

## 2019-05-08 DIAGNOSIS — S82401A Unspecified fracture of shaft of right fibula, initial encounter for closed fracture: Secondary | ICD-10-CM | POA: Diagnosis present

## 2019-05-08 DIAGNOSIS — S82251A Displaced comminuted fracture of shaft of right tibia, initial encounter for closed fracture: Secondary | ICD-10-CM | POA: Diagnosis present

## 2019-05-08 DIAGNOSIS — Y990 Civilian activity done for income or pay: Secondary | ICD-10-CM | POA: Diagnosis not present

## 2019-05-08 DIAGNOSIS — Z419 Encounter for procedure for purposes other than remedying health state, unspecified: Secondary | ICD-10-CM

## 2019-05-08 DIAGNOSIS — S82201A Unspecified fracture of shaft of right tibia, initial encounter for closed fracture: Secondary | ICD-10-CM | POA: Diagnosis present

## 2019-05-08 LAB — CBC
HCT: 44.4 % (ref 39.0–52.0)
Hemoglobin: 14.7 g/dL (ref 13.0–17.0)
MCH: 34.3 pg — ABNORMAL HIGH (ref 26.0–34.0)
MCHC: 33.1 g/dL (ref 30.0–36.0)
MCV: 103.7 fL — ABNORMAL HIGH (ref 80.0–100.0)
Platelets: 211 10*3/uL (ref 150–400)
RBC: 4.28 MIL/uL (ref 4.22–5.81)
RDW: 11.6 % (ref 11.5–15.5)
WBC: 11.6 10*3/uL — ABNORMAL HIGH (ref 4.0–10.5)
nRBC: 0 % (ref 0.0–0.2)

## 2019-05-08 LAB — BASIC METABOLIC PANEL
Anion gap: 10 (ref 5–15)
BUN: 9 mg/dL (ref 6–20)
CO2: 25 mmol/L (ref 22–32)
Calcium: 9.1 mg/dL (ref 8.9–10.3)
Chloride: 103 mmol/L (ref 98–111)
Creatinine, Ser: 0.86 mg/dL (ref 0.61–1.24)
GFR calc Af Amer: >60 mL/min (ref >60)
GFR calc non Af Amer: >60 mL/min (ref >60)
Glucose, Bld: 108 mg/dL — ABNORMAL HIGH (ref 70–99)
Potassium: 3.9 mmol/L (ref 3.5–5.1)
Sodium: 138 mmol/L (ref 135–145)

## 2019-05-08 MED ORDER — METHOCARBAMOL 500 MG PO TABS: 500.0000 mg | ORAL_TABLET | Freq: Four times a day (QID) | ORAL | Status: DC | PRN

## 2019-05-08 MED ORDER — ACETAMINOPHEN 325 MG PO TABS: 325.0000 mg | ORAL_TABLET | Freq: Four times a day (QID) | ORAL | Status: DC | PRN

## 2019-05-08 MED ORDER — DIPHENHYDRAMINE HCL 12.5 MG/5ML PO ELIX: 12.5000 mg | ORAL_SOLUTION | ORAL | Status: DC | PRN

## 2019-05-08 MED ORDER — OXYCODONE HCL 5 MG PO TABS
10.0000 mg | ORAL_TABLET | ORAL | Status: DC | PRN
  Administered 2019-05-09: 03:00:00 10 mg via ORAL
  Filled 2019-05-08: qty 2

## 2019-05-08 MED ORDER — ACETAMINOPHEN 500 MG PO TABS
1000.0000 mg | ORAL_TABLET | Freq: Four times a day (QID) | ORAL | Status: DC
  Administered 2019-05-09 (×2): 1000 mg via ORAL
  Filled 2019-05-08 (×2): qty 2

## 2019-05-08 MED ORDER — OXYCODONE HCL 5 MG PO TABS
5.0000 mg | ORAL_TABLET | ORAL | Status: DC | PRN
  Administered 2019-05-09: 10 mg via ORAL
  Filled 2019-05-08: qty 2

## 2019-05-08 MED ORDER — HYDROMORPHONE HCL 1 MG/ML IJ SOLN: 0.5000 mg | INTRAMUSCULAR | Status: DC | PRN

## 2019-05-08 MED ORDER — POLYETHYLENE GLYCOL 3350 17 G PO PACK: 17.0000 g | PACK | Freq: Every day | ORAL | Status: DC | PRN

## 2019-05-08 MED ORDER — ONDANSETRON HCL 4 MG PO TABS: 4.0000 mg | ORAL_TABLET | Freq: Four times a day (QID) | ORAL | Status: DC | PRN

## 2019-05-08 MED ORDER — METOCLOPRAMIDE HCL 5 MG/ML IJ SOLN: 5.0000 mg | Freq: Three times a day (TID) | INTRAMUSCULAR | Status: DC | PRN

## 2019-05-08 MED ORDER — SORBITOL 70 % SOLN
30.0000 mL | Freq: Every day | Status: DC | PRN
  Filled 2019-05-08: qty 30

## 2019-05-08 MED ORDER — MAGNESIUM CITRATE PO SOLN: 1.0000 | Freq: Once | ORAL | Status: DC | PRN

## 2019-05-08 MED ORDER — METHOCARBAMOL 1000 MG/10ML IJ SOLN
500.0000 mg | Freq: Four times a day (QID) | INTRAVENOUS | Status: DC | PRN
  Filled 2019-05-08: qty 5

## 2019-05-08 MED ORDER — METOCLOPRAMIDE HCL 10 MG PO TABS: 5.0000 mg | ORAL_TABLET | Freq: Three times a day (TID) | ORAL | Status: DC | PRN

## 2019-05-08 MED ORDER — ONDANSETRON HCL 4 MG/2ML IJ SOLN: 4.0000 mg | Freq: Four times a day (QID) | INTRAMUSCULAR | Status: DC | PRN

## 2019-05-08 MED ORDER — SODIUM CHLORIDE 0.9 % IV SOLN
INTRAVENOUS | Status: DC
  Administered 2019-05-08: 23:00:00 via INTRAVENOUS

## 2019-05-08 MED ORDER — GABAPENTIN 300 MG PO CAPS
300.0000 mg | ORAL_CAPSULE | Freq: Three times a day (TID) | ORAL | Status: DC
  Administered 2019-05-09 – 2019-05-10 (×3): 300 mg via ORAL
  Filled 2019-05-08 (×3): qty 1

## 2019-05-08 MED ORDER — DOCUSATE SODIUM 100 MG PO CAPS
100.0000 mg | ORAL_CAPSULE | Freq: Two times a day (BID) | ORAL | Status: DC
  Administered 2019-05-09: 02:00:00 100 mg via ORAL
  Filled 2019-05-08: qty 1

## 2019-05-08 MED ORDER — OXYCODONE-ACETAMINOPHEN 5-325 MG PO TABS
2.0000 | ORAL_TABLET | Freq: Once | ORAL | Status: AC
  Administered 2019-05-08: 22:00:00 2 via ORAL
  Filled 2019-05-08: qty 2

## 2019-05-08 NOTE — ED Triage Notes (Signed)
Patient here from home with complaints of right leg injury. Reports hitting it at work. Pain below the knee, above the ankle.

## 2019-05-08 NOTE — ED Provider Notes (Addendum)
[facility]-EMERGENCY DEPT Provider Note   CSN: [id number] Arrival date & time: 05/08/19  2125     History   Chief Complaint Chief Complaint  Patient presents with   Leg Injury    HPI John Kelley is a 28 y.o. male.     John Kelley is a 28 y.o. male who is otherwise healthy, presents to the ED from work for evaluation of right leg injury.  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  He reports severe pain at the mid shin radiating into the ankle he has noted some swelling and bruising since the injury occurred and he has not been able to bear weight.  He denies any pain at the knee or hip.  No injury to the left leg.  No bleeding or break in the skin.  He denies any numbness or weakness.  No previous injury or surgery to the right leg or ankle.  Nothing for pain prior to arrival.  No other aggravating or alleviating factors.     Past Medical History:  Diagnosis Date   Open wound of left foot 02/2017    Patient Active Problem List   Diagnosis Date Noted   Tibia/fibula fracture, right, closed, initial encounter 05/08/2019   Unspecified fracture of left foot, initial encounter for open fracture 01/08/2017    Past Surgical History:  Procedure Laterality Date   I&D EXTREMITY Left 01/08/2017   Procedure: IRRIGATION AND DEBRIDEMENT , closed reduction pinning of open foot fracture, application of wound vac.;  Surgeon: Venita Lick, MD;  Location: [facility];  Service: Orthopedics;  Laterality: Left;   INCISION AND DRAINAGE OF WOUND Left 01/25/2017   Procedure: DEBRIDEMENT OF LEFT FOOT WOUND WITH INTEGRA  APPLICATION;  Surgeon: Glenna Fellows, MD;  Location: [facility];  Service: Plastics;  Laterality: Left;   IRRIGATION AND  DEBRIDEMENT OF WOUND WITH SPLIT THICKNESS SKIN GRAFT Left 02/18/2017   Procedure: SPLIT THICKNESS SKIN GRAFT FROM LEFT THIGH TO LEFT FOOT,;  Surgeon: Glenna Fellows, MD;  Location: [facility];  Service: Plastics;  Laterality: Left;        Home Medications    Prior to Admission medications   Medication Sig Start Date End Date Taking? Authorizing Provider  aspirin EC 325 MG tablet Take 1 tablet (325 mg total) by mouth daily. 01/09/17   Jene Every, MD  docusate sodium (COLACE) 100 MG capsule Take 1 capsule (100 mg total) by mouth 2 (two) times daily as needed for mild constipation. 01/09/17   Jene Every, MD  methocarbamol (ROBAXIN) 500 MG tablet Take 1 tablet (500 mg total) by mouth every 6 (six) hours as needed for muscle spasms. 01/09/17   Jene Every, MD    Family History No family history on file.  Social History Social History   Tobacco Use   Smoking status: Former Smoker    Packs/day: 0.00    Quit date: 01/18/2017    Years since quitting: 2.3   Smokeless tobacco: Never Used  Substance Use Topics   Alcohol use: Yes    Comment: occasionally   Drug use: No     Allergies   Patient has no known allergies.   Review of Systems Review of Systems  Constitutional: Negative for chills and fever.  Musculoskeletal: Positive for arthralgias and joint swelling.  Skin: Positive for color change. Negative for wound.  Neurological: Negative for weakness and numbness.     Physical Exam Updated Vital Signs BP (!) 143/89 (BP Location: Left Arm)    Pulse 94    Temp 98 F (36.7 C) (Oral)    Resp 14    Ht 5\' 11"  (1.803 m)    Wt 56.7 kg    SpO2 96%    BMI 17.43 kg/m   Physical Exam Vitals signs and nursing note reviewed.  Constitutional:      General: He is not in acute distress.    Appearance: Normal appearance. He is well-developed and normal weight. He is not ill-appearing or diaphoretic.  HENT:     Head: Normocephalic and atraumatic.  Eyes:      General:        Right eye: No discharge.        Left eye: No discharge.  Pulmonary:     Effort: Pulmonary effort is normal. No respiratory distress.  Musculoskeletal:     Comments: Right shin with some swelling tenderness and ecchymosis in the distal shaft, swelling extends to the lateral malleolus with some tenderness in this area, palpable deformity over the shin, no palpable deformity over the malleoli or foot.  No tenderness or pain at the knee.  2+ DP and PT pulses, normal sensation.  Range of motion limited due to pain.  Skin:    General: Skin is warm and dry.     Capillary Refill: Capillary refill takes less than 2 seconds.  Neurological:     Mental Status: He is alert and oriented to person, place, and time.     Coordination: Coordination normal.  Psychiatric:        Mood and Affect: Mood normal.        Behavior: Behavior normal.      ED Treatments / Results  Labs (all labs ordered are listed, but only abnormal results are displayed) Labs Reviewed  SARS CORONAVIRUS 2 (TAT 6-24 HRS)  HIV ANTIBODY (ROUTINE TESTING W REFLEX)  CBC  BASIC METABOLIC PANEL    EKG None  Radiology Dg Tibia/fibula Right  Result Date: 05/08/2019 CLINICAL DATA:  Injury EXAM: RIGHT TIBIA AND FIBULA - 2 VIEW COMPARISON:  None. FINDINGS: There is comminuted impacted fracture seen through the distal tibia extending to the distal tibial metadiaphysis. No definite intra-articular extension is seen. No other fractures identified. The ankle mortise is congruent. Overlying soft tissue swelling is seen. IMPRESSION: Comminuted impacted fracture of the distal tibia. No definite intra-articular extension. Electronically Signed   By: Jonna ClarkBindu  Avutu M.D.   On: 05/08/2019 22:19   Dg Ankle Complete Right  Result Date: 05/08/2019 CLINICAL DATA:  Ankle injury at work. Fracture and tibia/fibular radiographs earlier today. EXAM: RIGHT ANKLE - COMPLETE 3+ VIEW COMPARISON:  Tibia/fibular radiographs earlier this day.  FINDINGS: Comminuted fracture of the distal tibia with mild displacement. No visualized intra-articular extension. There is mild apex posterior angulation. No definite ankle joint effusion. Ankle mortise is preserved. No other fracture of the ankle. Diffuse soft tissue edema. IMPRESSION: Comminuted distal tibial fracture with mild displacement and apex posterior angulation. No visualized intra-articular extension. Electronically Signed   By: Narda RutherfordMelanie  Sanford M.D.   On: 05/08/2019 22:48    Procedures Procedures (including critical care time)  Medications Ordered in ED Medications  0.9 %  sodium chloride infusion ( Intravenous New Bag/Given 05/08/19 2311)  methocarbamol (ROBAXIN) tablet 500 mg (has no administration in time range)    Or  methocarbamol (ROBAXIN) 500 mg in dextrose 5 % 50 mL IVPB (has no administration in time range)  diphenhydrAMINE (BENADRYL) 12.5 MG/5ML elixir 12.5-25 mg (has no administration in time range)  docusate sodium (COLACE) capsule 100 mg (has no administration in time range)  polyethylene glycol (MIRALAX / GLYCOLAX) packet 17 g (has no administration in time range)  sorbitol 70 % solution 30 mL (has no administration in time range)  magnesium citrate solution 1 Bottle (has no administration in time range)  ondansetron (ZOFRAN) tablet 4 mg (has no administration in time range)    Or  ondansetron (ZOFRAN) injection 4 mg (has no administration in time range)  metoCLOPramide (REGLAN) tablet 5-10 mg (has no administration in time range)    Or  metoCLOPramide (REGLAN) injection 5-10 mg (has no administration in time range)  acetaminophen (TYLENOL) tablet 325-650 mg (has no administration in time range)  oxyCODONE (Oxy IR/ROXICODONE) immediate release tablet 5-10 mg (has no administration in time range)  oxyCODONE (Oxy IR/ROXICODONE) immediate release tablet 10-15 mg (has no administration in time range)  HYDROmorphone (DILAUDID) injection 0.5-1 mg (has no administration  in time range)  acetaminophen (TYLENOL) tablet 1,000 mg (has no administration in time range)  gabapentin (NEURONTIN) capsule 300 mg (has no administration in time range)  oxyCODONE-acetaminophen (PERCOCET/ROXICET) 5-325 MG per tablet 2 tablet (2 tablets Oral Given 05/08/19 2228)     Initial Impression / Assessment and Plan / ED Course  I have reviewed the triage vital signs and the nursing notes.  Pertinent labs & imaging results that were available during my care of the patient were reviewed by me and considered in my medical decision making (see chart for details).  28 year old male presents after injury to the right shin while at work, had a pallet pressed into the right lower leg, and sustained a comminuted and impacted fracture of the distal tibia, no evidence of intra-articular extension.  Neurovascularly intact.  No other injuries, no pain at the knee or hip.  Patient otherwise healthy, last had food 7 hours ago.  Case discussed with Dr. Erlinda Hong with orthopedics who reviewed patient's films, patient will require surgical repair.  Patient will be admitted with plan for surgery in the morning.  Dr. Erlinda Hong request patient be transferred to [facility].  Covid swab and preop labs ordered.  Discussed this plan with the patient and he expresses understanding and agreement.  N.p.o. after midnight. Patient will be placed in a short leg splint for comfort and stabilization.  Final Clinical Impressions(s) / ED Diagnoses   Final diagnoses:  Closed nondisplaced comminuted fracture of shaft of right tibia, initial encounter    ED Discharge Orders    None       Jacqlyn Larsen, PA-C 05/08/19 2256    Jacqlyn Larsen, PA-C 05/08/19 2312    Daleen Bo, MD 05/09/19 [phone]

## 2019-05-09 ENCOUNTER — Inpatient Hospital Stay (HOSPITAL_COMMUNITY): Payer: No Typology Code available for payment source | Admitting: Anesthesiology

## 2019-05-09 ENCOUNTER — Encounter (HOSPITAL_COMMUNITY)
Admission: EM | Disposition: A | Discharge status: Home / Self Care | Payer: Self-pay | Source: Home / Self Care | Attending: Orthopaedic Surgery

## 2019-05-09 ENCOUNTER — Inpatient Hospital Stay (HOSPITAL_COMMUNITY): Payer: No Typology Code available for payment source

## 2019-05-09 ENCOUNTER — Encounter (HOSPITAL_COMMUNITY): Payer: Self-pay | Admitting: Orthopedic Surgery

## 2019-05-09 HISTORY — PX: TIBIA IM NAIL INSERTION: SHX2516

## 2019-05-09 LAB — HIV ANTIBODY (ROUTINE TESTING W REFLEX): HIV Screen 4th Generation wRfx: NONREACTIVE

## 2019-05-09 LAB — SARS CORONAVIRUS 2 (TAT 6-24 HRS): SARS Coronavirus 2: NEGATIVE

## 2019-05-09 SURGERY — INSERTION, INTRAMEDULLARY ROD, TIBIA
Anesthesia: General | Site: Leg Lower | Laterality: Right

## 2019-05-09 MED ORDER — ROCURONIUM BROMIDE 10 MG/ML (PF) SYRINGE
PREFILLED_SYRINGE | INTRAVENOUS | Status: DC | PRN
  Administered 2019-05-09: 40 mg via INTRAVENOUS

## 2019-05-09 MED ORDER — RIVAROXABAN 10 MG PO TABS
10.0000 mg | ORAL_TABLET | Freq: Every day | ORAL | Status: DC
  Administered 2019-05-10: 10:00:00 10 mg via ORAL
  Filled 2019-05-09: qty 1

## 2019-05-09 MED ORDER — MEPERIDINE HCL 25 MG/ML IJ SOLN: 6.2500 mg | INTRAMUSCULAR | Status: DC | PRN

## 2019-05-09 MED ORDER — PROPOFOL 10 MG/ML IV BOLUS
INTRAVENOUS | Status: AC
  Filled 2019-05-09: qty 20

## 2019-05-09 MED ORDER — BISACODYL 5 MG PO TBEC: 5.0000 mg | DELAYED_RELEASE_TABLET | Freq: Every day | ORAL | Status: DC | PRN

## 2019-05-09 MED ORDER — SUCCINYLCHOLINE CHLORIDE 20 MG/ML IJ SOLN
INTRAMUSCULAR | Status: DC | PRN
  Administered 2019-05-09: 100 mg via INTRAVENOUS

## 2019-05-09 MED ORDER — 0.9 % SODIUM CHLORIDE (POUR BTL) OPTIME
TOPICAL | Status: DC | PRN
  Administered 2019-05-09: 1000 mL

## 2019-05-09 MED ORDER — DOCUSATE SODIUM 100 MG PO CAPS
100.0000 mg | ORAL_CAPSULE | Freq: Two times a day (BID) | ORAL | Status: DC
  Administered 2019-05-09 – 2019-05-10 (×2): 100 mg via ORAL
  Filled 2019-05-09 (×2): qty 1

## 2019-05-09 MED ORDER — CEFAZOLIN SODIUM-DEXTROSE 2-3 GM-%(50ML) IV SOLR
INTRAVENOUS | Status: DC | PRN
  Administered 2019-05-09: 2 g via INTRAVENOUS

## 2019-05-09 MED ORDER — POVIDONE-IODINE 10 % EX SWAB: 2.0000 "application " | Freq: Once | CUTANEOUS | Status: DC

## 2019-05-09 MED ORDER — POLYETHYLENE GLYCOL 3350 17 G PO PACK: 17.0000 g | PACK | Freq: Every day | ORAL | Status: DC | PRN

## 2019-05-09 MED ORDER — ONDANSETRON HCL 4 MG/2ML IJ SOLN
INTRAMUSCULAR | Status: AC
  Filled 2019-05-09: qty 2

## 2019-05-09 MED ORDER — OXYCODONE HCL 5 MG PO TABS: 10.0000 mg | ORAL_TABLET | ORAL | Status: DC | PRN

## 2019-05-09 MED ORDER — METOCLOPRAMIDE HCL 5 MG/ML IJ SOLN: 5.0000 mg | Freq: Three times a day (TID) | INTRAMUSCULAR | Status: DC | PRN

## 2019-05-09 MED ORDER — ROCURONIUM BROMIDE 10 MG/ML (PF) SYRINGE
PREFILLED_SYRINGE | INTRAVENOUS | Status: AC
  Filled 2019-05-09: qty 10

## 2019-05-09 MED ORDER — CHLORHEXIDINE GLUCONATE 4 % EX LIQD: 60.0000 mL | Freq: Once | CUTANEOUS | Status: DC

## 2019-05-09 MED ORDER — VANCOMYCIN HCL 1000 MG IV SOLR
INTRAVENOUS | Status: DC | PRN
  Administered 2019-05-09: 1000 mg

## 2019-05-09 MED ORDER — LIDOCAINE 2% (20 MG/ML) 5 ML SYRINGE
INTRAMUSCULAR | Status: DC | PRN
  Administered 2019-05-09: 60 mg via INTRAVENOUS

## 2019-05-09 MED ORDER — FENTANYL CITRATE (PF) 250 MCG/5ML IJ SOLN
INTRAMUSCULAR | Status: AC
  Filled 2019-05-09: qty 5

## 2019-05-09 MED ORDER — CEFAZOLIN SODIUM-DEXTROSE 2-4 GM/100ML-% IV SOLN
2.0000 g | INTRAVENOUS | Status: DC
  Filled 2019-05-09: qty 100

## 2019-05-09 MED ORDER — HYDROMORPHONE HCL 1 MG/ML IJ SOLN
INTRAMUSCULAR | Status: AC
  Filled 2019-05-09: qty 1

## 2019-05-09 MED ORDER — OXYCODONE HCL 5 MG PO TABS: 5.0000 mg | ORAL_TABLET | ORAL | Status: DC | PRN

## 2019-05-09 MED ORDER — DEXAMETHASONE SODIUM PHOSPHATE 10 MG/ML IJ SOLN
INTRAMUSCULAR | Status: DC | PRN
  Administered 2019-05-09: 5 mg via INTRAVENOUS

## 2019-05-09 MED ORDER — BUPIVACAINE HCL 0.25 % IJ SOLN
INTRAMUSCULAR | Status: DC | PRN
  Administered 2019-05-09: 10 mL

## 2019-05-09 MED ORDER — LIDOCAINE 2% (20 MG/ML) 5 ML SYRINGE
INTRAMUSCULAR | Status: AC
  Filled 2019-05-09: qty 5

## 2019-05-09 MED ORDER — MAGNESIUM CITRATE PO SOLN: 1.0000 | Freq: Once | ORAL | Status: DC | PRN

## 2019-05-09 MED ORDER — SUGAMMADEX SODIUM 200 MG/2ML IV SOLN
INTRAVENOUS | Status: DC | PRN
  Administered 2019-05-09: 120 mg via INTRAVENOUS

## 2019-05-09 MED ORDER — CEFAZOLIN SODIUM-DEXTROSE 2-4 GM/100ML-% IV SOLN
2.0000 g | Freq: Four times a day (QID) | INTRAVENOUS | Status: AC
  Administered 2019-05-09 – 2019-05-10 (×2): 2 g via INTRAVENOUS
  Filled 2019-05-09 (×2): qty 100

## 2019-05-09 MED ORDER — ONDANSETRON HCL 4 MG/2ML IJ SOLN: 4.0000 mg | Freq: Four times a day (QID) | INTRAMUSCULAR | Status: DC | PRN

## 2019-05-09 MED ORDER — HYDROMORPHONE HCL 1 MG/ML IJ SOLN
0.2500 mg | INTRAMUSCULAR | Status: DC | PRN
  Administered 2019-05-09 (×2): 0.5 mg via INTRAVENOUS

## 2019-05-09 MED ORDER — OXYCODONE HCL 5 MG PO TABS: 5.0000 mg | ORAL_TABLET | Freq: Once | ORAL | Status: DC | PRN

## 2019-05-09 MED ORDER — VANCOMYCIN HCL 1000 MG IV SOLR
INTRAVENOUS | Status: AC
  Filled 2019-05-09: qty 1000

## 2019-05-09 MED ORDER — DEXAMETHASONE SODIUM PHOSPHATE 10 MG/ML IJ SOLN
INTRAMUSCULAR | Status: AC
  Filled 2019-05-09: qty 1

## 2019-05-09 MED ORDER — CEFAZOLIN SODIUM 1 G IJ SOLR
INTRAMUSCULAR | Status: AC
  Filled 2019-05-09: qty 20

## 2019-05-09 MED ORDER — OXYCODONE HCL 5 MG/5ML PO SOLN: 5.0000 mg | Freq: Once | ORAL | Status: DC | PRN

## 2019-05-09 MED ORDER — HYDROMORPHONE HCL 1 MG/ML IJ SOLN: 0.5000 mg | INTRAMUSCULAR | Status: DC | PRN

## 2019-05-09 MED ORDER — PROMETHAZINE HCL 25 MG/ML IJ SOLN: 6.2500 mg | INTRAMUSCULAR | Status: DC | PRN

## 2019-05-09 MED ORDER — ACETAMINOPHEN 500 MG PO TABS
1000.0000 mg | ORAL_TABLET | Freq: Three times a day (TID) | ORAL | Status: DC
  Administered 2019-05-09 – 2019-05-10 (×2): 1000 mg via ORAL
  Filled 2019-05-09 (×2): qty 2

## 2019-05-09 MED ORDER — LACTATED RINGERS IV SOLN
INTRAVENOUS | Status: DC
  Administered 2019-05-09 (×2): via INTRAVENOUS

## 2019-05-09 MED ORDER — PROPOFOL 10 MG/ML IV BOLUS
INTRAVENOUS | Status: DC | PRN
  Administered 2019-05-09: 130 mg via INTRAVENOUS

## 2019-05-09 MED ORDER — MIDAZOLAM HCL 5 MG/5ML IJ SOLN
INTRAMUSCULAR | Status: DC | PRN
  Administered 2019-05-09: 2 mg via INTRAVENOUS

## 2019-05-09 MED ORDER — FENTANYL CITRATE (PF) 100 MCG/2ML IJ SOLN
INTRAMUSCULAR | Status: DC | PRN
  Administered 2019-05-09: 50 ug via INTRAVENOUS
  Administered 2019-05-09: 100 ug via INTRAVENOUS
  Administered 2019-05-09: 50 ug via INTRAVENOUS

## 2019-05-09 MED ORDER — MIDAZOLAM HCL 2 MG/2ML IJ SOLN
INTRAMUSCULAR | Status: AC
  Filled 2019-05-09: qty 2

## 2019-05-09 MED ORDER — ONDANSETRON HCL 4 MG PO TABS: 4.0000 mg | ORAL_TABLET | Freq: Four times a day (QID) | ORAL | Status: DC | PRN

## 2019-05-09 MED ORDER — METOCLOPRAMIDE HCL 5 MG PO TABS: 5.0000 mg | ORAL_TABLET | Freq: Three times a day (TID) | ORAL | Status: DC | PRN

## 2019-05-09 MED ORDER — MENTHOL 3 MG MT LOZG: 1.0000 | LOZENGE | OROMUCOSAL | Status: DC | PRN

## 2019-05-09 MED ORDER — BUPIVACAINE HCL (PF) 0.25 % IJ SOLN
INTRAMUSCULAR | Status: AC
  Filled 2019-05-09: qty 30

## 2019-05-09 MED ORDER — PHENOL 1.4 % MT LIQD: 1.0000 | OROMUCOSAL | Status: DC | PRN

## 2019-05-09 MED ORDER — ONDANSETRON HCL 4 MG/2ML IJ SOLN
INTRAMUSCULAR | Status: DC | PRN
  Administered 2019-05-09: 4 mg via INTRAVENOUS

## 2019-05-09 SURGICAL SUPPLY — 86 items
APL PRP STRL LF DISP 70% ISPRP (MISCELLANEOUS) ×2
BANDAGE ESMARK 6X9 LF (GAUZE/BANDAGES/DRESSINGS) ×2 IMPLANT
BIT DRILL CALIBRATED 4.3X320MM (BIT) IMPLANT
BIT DRILL CROWE POINT TWST 4.3 (DRILL) IMPLANT
BLADE CLIPPER SURG (BLADE) IMPLANT
BNDG CMPR 9X6 STRL LF SNTH (GAUZE/BANDAGES/DRESSINGS) ×2
BNDG COHESIVE 4X5 TAN STRL (GAUZE/BANDAGES/DRESSINGS) IMPLANT
BNDG ELASTIC 4X5.8 VLCR STR LF (GAUZE/BANDAGES/DRESSINGS) ×4 IMPLANT
BNDG ELASTIC 6X5.8 VLCR STR LF (GAUZE/BANDAGES/DRESSINGS) ×4 IMPLANT
BNDG ESMARK 6X9 LF (GAUZE/BANDAGES/DRESSINGS) ×4
BNDG GAUZE ELAST 4 BULKY (GAUZE/BANDAGES/DRESSINGS) ×4 IMPLANT
BRUSH SCRUB EZ PLAIN DRY (MISCELLANEOUS) ×8 IMPLANT
CHLORAPREP W/TINT 26 (MISCELLANEOUS) ×4 IMPLANT
CLOSURE STERI-STRIP 1/2X4 (GAUZE/BANDAGES/DRESSINGS) ×1
CLSR STERI-STRIP ANTIMIC 1/2X4 (GAUZE/BANDAGES/DRESSINGS) ×1 IMPLANT
COVER MAYO STAND STRL (DRAPES) ×4 IMPLANT
COVER WAND RF STERILE (DRAPES) ×4 IMPLANT
DRAPE C-ARM 42X72 X-RAY (DRAPES) ×4 IMPLANT
DRAPE C-ARMOR (DRAPES) ×4 IMPLANT
DRAPE HALF SHEET 40X57 (DRAPES) ×8 IMPLANT
DRAPE INCISE IOBAN 66X45 STRL (DRAPES) ×4 IMPLANT
DRAPE U-SHAPE 47X51 STRL (DRAPES) ×4 IMPLANT
DRILL CALIBRATED 4.3X320MM (BIT) ×4
DRILL CROWE POINT TWIST 4.3 (DRILL) ×8
DRSG ADAPTIC 3X8 NADH LF (GAUZE/BANDAGES/DRESSINGS) ×4 IMPLANT
DRSG AQUACEL AG ADV 3.5X 6 (GAUZE/BANDAGES/DRESSINGS) ×2 IMPLANT
DRSG EMULSION OIL 3X3 NADH (GAUZE/BANDAGES/DRESSINGS) ×2 IMPLANT
DRSG PAD ABDOMINAL 8X10 ST (GAUZE/BANDAGES/DRESSINGS) ×16 IMPLANT
ELECT REM PT RETURN 9FT ADLT (ELECTROSURGICAL) ×4
ELECTRODE REM PT RTRN 9FT ADLT (ELECTROSURGICAL) ×2 IMPLANT
GAUZE SPONGE 4X4 12PLY STRL (GAUZE/BANDAGES/DRESSINGS) ×4 IMPLANT
GLOVE BIO SURGEON STRL SZ 6.5 (GLOVE) ×9 IMPLANT
GLOVE BIO SURGEON STRL SZ7.5 (GLOVE) ×16 IMPLANT
GLOVE BIO SURGEONS STRL SZ 6.5 (GLOVE) ×3
GLOVE BIOGEL PI IND STRL 6.5 (GLOVE) ×2 IMPLANT
GLOVE BIOGEL PI IND STRL 7.5 (GLOVE) ×2 IMPLANT
GLOVE BIOGEL PI INDICATOR 6.5 (GLOVE) ×2
GLOVE BIOGEL PI INDICATOR 7.5 (GLOVE) ×2
GLOVE PROGUARD SZ 7 1/2 (GLOVE) ×4 IMPLANT
GOWN STRL REUS W/ TWL LRG LVL3 (GOWN DISPOSABLE) ×4 IMPLANT
GOWN STRL REUS W/TWL LRG LVL3 (GOWN DISPOSABLE) ×8
GUIDEPIN 3.2X17.5 THRD DISP (PIN) ×2 IMPLANT
GUIDEWIRE 2.6X80 BEAD TIP (WIRE) IMPLANT
GUIDWIRE 2.6X80 BEAD TIP (WIRE) ×4
KIT BASIN OR (CUSTOM PROCEDURE TRAY) ×4 IMPLANT
KIT TURNOVER KIT B (KITS) ×4 IMPLANT
MANIFOLD NEPTUNE II (INSTRUMENTS) ×4 IMPLANT
NAIL TIBIAL PHOENIX 9.0X360 (Nail) ×2 IMPLANT
NEEDLE HYPO 22GX1.5 SAFETY (NEEDLE) ×2 IMPLANT
NS IRRIG 1000ML POUR BTL (IV SOLUTION) ×4 IMPLANT
PACK TOTAL JOINT (CUSTOM PROCEDURE TRAY) ×4 IMPLANT
PAD ABD 8X10 STRL (GAUZE/BANDAGES/DRESSINGS) ×2 IMPLANT
PAD ARMBOARD 7.5X6 YLW CONV (MISCELLANEOUS) ×8 IMPLANT
PAD CAST 4YDX4 CTTN HI CHSV (CAST SUPPLIES) ×2 IMPLANT
PADDING CAST COTTON 4X4 STRL (CAST SUPPLIES) ×4
PADDING CAST COTTON 6X4 STRL (CAST SUPPLIES) ×4 IMPLANT
SCREW CORT TI DBL LEAD 5X32 (Screw) ×2 IMPLANT
SCREW CORT TI DBL LEAD 5X38 (Screw) ×2 IMPLANT
SCREW CORT TI DBL LEAD 5X44 (Screw) ×2 IMPLANT
SCREW CORT TI DBL LEAD 5X50 (Screw) ×2 IMPLANT
SPONGE LAP 18X18 RF (DISPOSABLE) IMPLANT
STAPLER VISISTAT 35W (STAPLE) ×4 IMPLANT
STOCKINETTE IMPERVIOUS LG (DRAPES) IMPLANT
SUCTION FRAZIER HANDLE 10FR (MISCELLANEOUS) ×2
SUCTION TUBE FRAZIER 10FR DISP (MISCELLANEOUS) ×2 IMPLANT
SUT ETHILON 2 0 FS 18 (SUTURE) ×4 IMPLANT
SUT ETHILON 3 0 PS 1 (SUTURE) IMPLANT
SUT MNCRL AB 3-0 PS2 18 (SUTURE) ×4 IMPLANT
SUT MON AB 4-0 PS1 27 (SUTURE) ×2 IMPLANT
SUT PROLENE 0 CT (SUTURE) IMPLANT
SUT VIC AB 0 CT1 27 (SUTURE) ×4
SUT VIC AB 0 CT1 27XBRD ANBCTR (SUTURE) ×2 IMPLANT
SUT VIC AB 1 CT1 27 (SUTURE)
SUT VIC AB 1 CT1 27XBRD ANBCTR (SUTURE) ×2 IMPLANT
SUT VIC AB 2-0 CT1 27 (SUTURE)
SUT VIC AB 2-0 CT1 TAPERPNT 27 (SUTURE) ×4 IMPLANT
SUT VIC AB 3-0 SH 27 (SUTURE) ×4
SUT VIC AB 3-0 SH 27X BRD (SUTURE) IMPLANT
SYR CONTROL 10ML LL (SYRINGE) ×2 IMPLANT
TOWEL GREEN STERILE (TOWEL DISPOSABLE) ×8 IMPLANT
TOWEL GREEN STERILE FF (TOWEL DISPOSABLE) ×4 IMPLANT
TRAY FOLEY MTR SLVR 16FR STAT (SET/KITS/TRAYS/PACK) IMPLANT
TUBE CONNECTING 12'X1/4 (SUCTIONS) ×1
TUBE CONNECTING 12X1/4 (SUCTIONS) ×3 IMPLANT
WATER STERILE IRR 1000ML POUR (IV SOLUTION) ×8 IMPLANT
YANKAUER SUCT BULB TIP NO VENT (SUCTIONS) ×4 IMPLANT

## 2019-05-09 NOTE — ED Notes (Signed)
RN is attempting to call [facility] for Transport.

## 2019-05-09 NOTE — ED Notes (Signed)
RN has been informed by April RN from [facility] that patient will need to be transferred over to [facility] by 1200.   April RN [phone]

## 2019-05-09 NOTE — Transfer of Care (Signed)
Immediate Anesthesia Transfer of Care Note  Patient: [place] L Keefe  Procedure(s) Performed: Intramedullary (Im) Nail Tibial (Right Leg Lower)  Patient Location: PACU  Anesthesia Type:General  Level of Consciousness: awake, alert  and oriented  Airway & Oxygen Therapy: Patient Spontanous Breathing  Post-op Assessment: Report given to RN  Post vital signs: Reviewed and stable  Last Vitals:  Vitals Value Taken Time  BP    Temp    Pulse 114 05/09/19 1712  Resp    SpO2 99 % 05/09/19 1712  Vitals shown include unvalidated device data.  Last Pain:  Vitals:   05/09/19 1145  TempSrc:   PainSc: 8       Patients Stated Pain Goal: 3 (78/24/23 5361)  Complications: No apparent anesthesia complications

## 2019-05-09 NOTE — Anesthesia Preprocedure Evaluation (Signed)
Anesthesia Evaluation  Patient identified by MRN, date of birth, ID band Patient awake    Reviewed: Allergy & Precautions, H&P , NPO status , Patient's Chart, lab work & pertinent test results  Airway Mallampati: I  TM Distance: >3 FB Neck ROM: Full    Dental no notable dental hx. (+) Teeth Intact, Dental Advisory Given   Pulmonary former smoker,    Pulmonary exam normal breath sounds clear to auscultation       Cardiovascular negative cardio ROS   Rhythm:Regular     Neuro/Psych negative neurological ROS  negative psych ROS   GI/Hepatic negative GI ROS, Neg liver ROS,   Endo/Other  negative endocrine ROS  Renal/GU negative Renal ROS  negative genitourinary   Musculoskeletal   Abdominal   Peds  Hematology negative hematology ROS (+)   Anesthesia Other Findings   Reproductive/Obstetrics negative OB ROS                             Anesthesia Physical  Anesthesia Plan  ASA: I  Anesthesia Plan: General   Post-op Pain Management:    Induction: Intravenous  PONV Risk Score and Plan: 3 and Ondansetron, Dexamethasone, Midazolam and Treatment may vary due to age or medical condition  Airway Management Planned: LMA  Additional Equipment:   Intra-op Plan:   Post-operative Plan: Extubation in OR  Informed Consent: I have reviewed the patients History and Physical, chart, labs and discussed the procedure including the risks, benefits and alternatives for the proposed anesthesia with the patient or authorized representative who has indicated his/her understanding and acceptance.     Dental advisory given  Plan Discussed with: CRNA  Anesthesia Plan Comments:         Anesthesia Quick Evaluation

## 2019-05-09 NOTE — Anesthesia Procedure Notes (Signed)
Procedure Name: Intubation Date/Time: 05/09/2019 3:36 PM Performed by: Jenne Campus, CRNA Pre-anesthesia Checklist: Patient identified, Emergency Drugs available, Suction available and Patient being monitored Patient Re-evaluated:Patient Re-evaluated prior to induction Oxygen Delivery Method: Circle System Utilized Preoxygenation: Pre-oxygenation with 100% oxygen Induction Type: IV induction Ventilation: Mask ventilation without difficulty Laryngoscope Size: Mac and 3 Grade View: Grade I Tube type: Oral Tube size: 7.5 mm Number of attempts: 1 Airway Equipment and Method: Stylet and Oral airway Placement Confirmation: ETT inserted through vocal cords under direct vision,  positive ETCO2 and breath sounds checked- equal and bilateral Tube secured with: Tape Dental Injury: Teeth and Oropharynx as per pre-operative assessment

## 2019-05-09 NOTE — Op Note (Signed)
Orthopaedic Surgery Operative Note (CSN: [id number])  John Kelley  1990-08-26 Date of Surgery: 05/09/2019   Diagnoses:  Right distal tibial crush injury with extra-articular fracture  Procedure: Right distal tibial intramedullary nailing   Operative Finding Successful completion of the planned procedure.  Very distal comminuted fracture but the patient did well and had good fixation.  His skin sloughing will be monitored.  No complications during the procedure.   Post-operative plan: The patient will be nonweightbearing in a boot to monitor her skin and potentially even transition to a cast in the later term after return to clinic depending on the skin condition.  The patient will be admitted overnight for pain control.  DVT prophylaxis Xarelto 10 mg/day.   Pain control with PRN pain medication preferring oral medicines.  Follow up plan will be scheduled in approximately 7 days for incision check and XR.  Post-Op Diagnosis: Same Surgeons:Primary: Bjorn Pippin, MD Assistants:Caroline McBane PA-C Location: [facility] OR ROOM 06 Anesthesia: General with local anesthesia at the knee incision Antibiotics: Ancef 2 g with local vancomycin powder 1 g at the surgical site Tourniquet time: * No tourniquets in log * Estimated Blood Loss: Minimal Complications: None Specimens: None Implants: Implant Name Type Inv. Item Serial No. Manufacturer Lot No. LRB No. Used Action  NAIL TIBIAL [place] 9.0X360 - [id number] Nail NAIL TIBIAL [place] 9.0X360  [employer](ORTH,TRAU,BIO,SG) [id number] Right 1 Implanted  SCREW CORT TI DBL LEAD 5X50 - [id number] Screw SCREW CORT TI DBL LEAD 5X50  [employer](ORTH,TRAU,BIO,SG) [id number] Right 1 Implanted  SCREW CORT TI DBL LEAD 5X38 - [id number] Screw SCREW CORT TI DBL LEAD 5X38  [employer](ORTH,TRAU,BIO,SG) [id number] Right 1 Implanted  SCREW CORT TI DBL LEAD 5X44 - [id number] Screw SCREW CORT TI DBL LEAD 5X44  [employer](ORTH,TRAU,BIO,SG) [id number] Right 1 Implanted  SCREW  CORT TI DBL LEAD 5X32 - [id number] Screw SCREW CORT TI DBL LEAD 5X32  [employer](ORTH,TRAU,BIO,SG) [id number] Right 1 Implanted    Indications for Surgery:   John Kelley is a 28 y.o. male with significant distal soft tissue injury with fracture blisters due to his crush injury and sloughing of the skin.  He is not a good candidate for external fixation as he had a minimally displaced fracture and continuing the splint him could have caused further skin breakdown.  We felt that intramedullary nailing will be appropriate in the setting of this distal fracture. .  Benefits and risks of operative and nonoperative management were discussed prior to surgery with patient/guardian(s) and informed consent form was completed.  Specific risks including infection, need for additional surgery, nonunion, malunion, infection and need for further surgery including a exchange nailing.   Procedure:   The patient was identified properly. Informed consent was obtained and the surgical site was marked. The patient was taken up to suite where general anesthesia was induced.  The patient was positioned supine on a radiolucent table.  The right tibia was prepped and draped in the usual sterile fashion.  Timeout was performed before the beginning of the case.  We began with a lateral parapatellar approach to the proximal tibia.  Incision was made midline overlying the distal one half of the patella as well as the proximal aspect of the patellar tendon.  We dissected down to layer 1 sharply and then raised thick skin flaps.  We are then able to move the tissue laterally and expose the lateral aspect of the patella and the patellar tendon.  Retinaculum was incised sharply taking care  to avoid involvement of the capsule thus we did not become intra-articular.  We then were able to sharply release the lateral retinaculum for later closure to allow the patella to translate medially for the semi-extended nailing position.   Once  this was performed we then began with our approach to the proximal must with a tibia and placed a guidewire on orthogonal views at the medial aspect of the lateral tibial eminence.  This was placed just off the anterior lip of the tibia as is typical for starting point for tibial nail.  This was then advanced on orthogonal views and an entry reamer was used to open the tibial canal.  There is a very distal comminuted fracture and based on the skin issues we felt that fixation with the nail was appropriate.  We able to hold this reduced more able to pass a guidewire and reamed.   Once this was performed were able to advance a ball-tipped guidewire down the length of the tibia and held the fracture reduced while the wire was passed across the fracture site itself.  This was passed to the level of the distal physeal scar.  This point we obtained a measurement using fluoroscopy to guide her management.  We measured for a 36cm nail.  We then began with reaming.  Sequentially reamed up from an 8 mm size to a 10.7mm size containing chatter with our largest reamers.  We selected a 72mm nail based on this.  Fracture was held reduced throughout the reaming process.   This point a Biomet [place] 55mm x 36 cm and placed under orthogonal fluoroscopic images to the appropriate position.  Were happy with our length rotation and alignment and placed 1 proximal interlocks as well as 3 distal interlocks with the distal interlocks being placed through perfect circle technique.   Final assessment of rotation and alignment was appropriate and we are satisfied with final fluoroscopic images.     We irrigated the wound copiously before placing local antibiotic as listed above.  Close the incision in a multilayer fashion with absorbable suture but we used nylon near his traumatized skin distally.  Sterile dressing was placed including adaptic and gauze over the traumatized skin.  Patient was awoken taken to PACU in stable  condition.  Noemi Chapel, PA-C, present and scrubbed throughout the case, critical for completion in a timely fashion, and for retraction, instrumentation, closure.

## 2019-05-09 NOTE — ED Notes (Signed)
Consent has been filled out and is ready for Patient, Surgeon and witnessing Nurse Signature.

## 2019-05-09 NOTE — ED Notes (Signed)
[facility] has been called and are Data processing manager.

## 2019-05-09 NOTE — Progress Notes (Signed)
Progress received from PACU, mom at bedside.  Pain controlled, patient oriented to room, toes warm w/sensation.  SCD's placed on left LE.

## 2019-05-09 NOTE — Progress Notes (Signed)
I was asked by Dr. Doreatha Martin to aid in the patient's care.  In short he had a right distal tibial crush injury and has significant skin damage secondary to this.  He does not have an open fracture no sign of compartment syndrome.  Due to an emergent case requiring Dr. Tama Headings care I was asked to provide care for this patient.  Talk about the progress including compartment syndrome, infection, skin breakdown and the need for further surgery as well as nonunion.  John Kelley agreed with proceeding and elected to go forward with surgery.  Tibial IM nail.

## 2019-05-09 NOTE — Consult Note (Signed)
Reason for Consult:Tibia fx Referring Physician: M Xu  John Kelley is an 28 y.o. male.  HPI: John was at work and got his right leg caught between a Pensions consultant and a pallet. He had immediate pain and could not bear weight. He came to the ED where x-rays showed a tibia fx and orthopedic surgery was consulted. He c/o localized pain to the area.  Past Medical History:  Diagnosis Date  . Open wound of left foot 02/2017    Past Surgical History:  Procedure Laterality Date  . I&D EXTREMITY Left 01/08/2017   Procedure: IRRIGATION AND DEBRIDEMENT , closed reduction pinning of open foot fracture, application of wound vac.;  Surgeon: Melina Schools, MD;  Location: [facility];  Service: Orthopedics;  Laterality: Left;  . INCISION AND DRAINAGE OF WOUND Left 01/25/2017   Procedure: DEBRIDEMENT OF LEFT FOOT WOUND WITH INTEGRA  APPLICATION;  Surgeon: Irene Limbo, MD;  Location: [place];  Service: Plastics;  Laterality: Left;  . IRRIGATION AND DEBRIDEMENT OF WOUND WITH SPLIT THICKNESS SKIN GRAFT Left 02/18/2017   Procedure: SPLIT THICKNESS SKIN GRAFT FROM LEFT THIGH TO LEFT FOOT,;  Surgeon: Irene Limbo, MD;  Location: [place];  Service: Plastics;  Laterality: Left;    No family history on file.  Social History:  reports that he quit smoking about 2 years ago. He smoked 0.00 packs per day. He has never used smokeless tobacco. He reports current alcohol use. He reports that he does not use drugs.  Allergies: No Known Allergies  Medications: I have reviewed the patient's current medications.  Results for orders placed or performed during the hospital encounter of 05/08/19 (from the past 48 hour(s))  CBC     Status: Abnormal   Collection Time: 05/08/19 11:09 PM  Result Value Ref Range   WBC 11.6 (H) 4.0 - 10.5 K/uL   RBC 4.28 4.22 - 5.81 MIL/uL   Hemoglobin 14.7 13.0 - 17.0 g/dL   HCT 44.4 39.0 - 52.0 %   MCV 103.7 (H) 80.0 - 100.0 fL   MCH 34.3 (H)  26.0 - 34.0 pg   MCHC 33.1 30.0 - 36.0 g/dL   RDW 11.6 11.5 - 15.5 %   Platelets 211 150 - 400 K/uL   nRBC 0.0 0.0 - 0.2 %    Comment: Performed at [facility], [place] [address], [place], [place] [address]  Basic metabolic panel     Status: Abnormal   Collection Time: 05/08/19 11:09 PM  Result Value Ref Range   Sodium 138 135 - 145 mmol/L   Potassium 3.9 3.5 - 5.1 mmol/L   Chloride 103 98 - 111 mmol/L   CO2 25 22 - 32 mmol/L   Glucose, Bld 108 (H) 70 - 99 mg/dL   BUN 9 6 - 20 mg/dL   Creatinine, Ser 0.86 0.61 - 1.24 mg/dL   Calcium 9.1 8.9 - 10.3 mg/dL   GFR calc non Af Amer >60 >60 mL/min   GFR calc Af Amer >60 >60 mL/min   Anion gap 10 5 - 15    Comment: Performed at [facility], [place] [address], [place], [place] [address]    Dg Tibia/fibula Right  Result Date: 05/08/2019 CLINICAL DATA:  Injury EXAM: RIGHT TIBIA AND FIBULA - 2 VIEW COMPARISON:  None. FINDINGS: There is comminuted impacted fracture seen through the distal tibia extending to the distal tibial metadiaphysis. No definite intra-articular extension is seen. No other fractures identified. The ankle  mortise is congruent. Overlying soft tissue swelling is seen. IMPRESSION: Comminuted impacted fracture of the distal tibia. No definite intra-articular extension. Electronically Signed   By: Jonna Clark M.D.   On: 05/08/2019 22:19   Dg Ankle Complete Right  Result Date: 05/08/2019 CLINICAL DATA:  Ankle injury at work. Fracture and tibia/fibular radiographs earlier today. EXAM: RIGHT ANKLE - COMPLETE 3+ VIEW COMPARISON:  Tibia/fibular radiographs earlier this day. FINDINGS: Comminuted fracture of the distal tibia with mild displacement. No visualized intra-articular extension. There is mild apex posterior angulation. No definite ankle joint effusion. Ankle mortise is preserved. No other fracture of the ankle. Diffuse soft tissue edema. IMPRESSION: Comminuted distal tibial fracture with mild  displacement and apex posterior angulation. No visualized intra-articular extension. Electronically Signed   By: Narda Rutherford M.D.   On: 05/08/2019 22:48    Review of Systems  Constitutional: Negative for weight loss.  HENT: Negative for ear discharge, ear pain, hearing loss and tinnitus.   Eyes: Negative for blurred vision, double vision, photophobia and pain.  Respiratory: Negative for cough, sputum production and shortness of breath.   Cardiovascular: Negative for chest pain.  Gastrointestinal: Negative for abdominal pain, nausea and vomiting.  Genitourinary: Negative for dysuria, flank pain, frequency and urgency.  Musculoskeletal: Positive for joint pain (Right lower leg). Negative for back pain, falls, myalgias and neck pain.  Neurological: Negative for dizziness, tingling, sensory change, focal weakness, loss of consciousness and headaches.  Endo/Heme/Allergies: Does not bruise/bleed easily.  Psychiatric/Behavioral: Negative for depression, memory loss and substance abuse. The patient is not nervous/anxious.    Blood pressure 138/76, pulse 70, temperature 98 F (36.7 C), temperature source Oral, resp. rate 17, height 5\' 11"  (1.803 m), weight 56.7 kg, SpO2 96 %. Physical Exam  Constitutional: He appears well-developed and well-nourished. No distress.  HENT:  Head: Normocephalic and atraumatic.  Eyes: Conjunctivae are normal. Right eye exhibits no discharge. Left eye exhibits no discharge. No scleral icterus.  Neck: Normal range of motion.  Cardiovascular: Normal rate and regular rhythm.  Respiratory: Effort normal. No respiratory distress.  Musculoskeletal:     Comments: LLE No traumatic wounds, ecchymosis, or rash  Splint in place  Sens DPN, SPN intact  Motor EHL 5/5  Toes warm, No significant edema  Neurological: He is alert.  Skin: Skin is warm and dry. He is not diaphoretic.  Psychiatric: He has a normal mood and affect. His behavior is normal.     Assessment/Plan: Right tibia fx -- Plan ORIF this afternoon by Dr. . NPO until then.    Jena Gauss, PA-C Orthopedic Surgery [phone] 05/09/2019, 9:20 AM

## 2019-05-10 ENCOUNTER — Encounter (HOSPITAL_COMMUNITY): Payer: Self-pay | Admitting: Orthopaedic Surgery

## 2019-05-10 LAB — BASIC METABOLIC PANEL
Anion gap: 9 (ref 5–15)
BUN: 6 mg/dL (ref 6–20)
CO2: 25 mmol/L (ref 22–32)
Calcium: 9 mg/dL (ref 8.9–10.3)
Chloride: 104 mmol/L (ref 98–111)
Creatinine, Ser: 0.77 mg/dL (ref 0.61–1.24)
GFR calc Af Amer: >60 mL/min (ref >60)
GFR calc non Af Amer: >60 mL/min (ref >60)
Glucose, Bld: 133 mg/dL — ABNORMAL HIGH (ref 70–99)
Potassium: 4.2 mmol/L (ref 3.5–5.1)
Sodium: 138 mmol/L (ref 135–145)

## 2019-05-10 LAB — CBC
HCT: 37 % — ABNORMAL LOW (ref 39.0–52.0)
Hemoglobin: 12.5 g/dL — ABNORMAL LOW (ref 13.0–17.0)
MCH: 34.3 pg — ABNORMAL HIGH (ref 26.0–34.0)
MCHC: 33.8 g/dL (ref 30.0–36.0)
MCV: 101.6 fL — ABNORMAL HIGH (ref 80.0–100.0)
Platelets: 185 10*3/uL (ref 150–400)
RBC: 3.64 MIL/uL — ABNORMAL LOW (ref 4.22–5.81)
RDW: 11.2 % — ABNORMAL LOW (ref 11.5–15.5)
WBC: 13.6 10*3/uL — ABNORMAL HIGH (ref 4.0–10.5)
nRBC: 0 % (ref 0.0–0.2)

## 2019-05-10 MED ORDER — RIVAROXABAN 10 MG PO TABS: 10.0000 mg | ORAL_TABLET | Freq: Every day | ORAL | 0 refills | Status: DC

## 2019-05-10 MED ORDER — ACETAMINOPHEN 500 MG PO TABS: 1000.0000 mg | ORAL_TABLET | Freq: Three times a day (TID) | ORAL | 0 refills | Status: AC

## 2019-05-10 MED ORDER — OXYCODONE HCL 5 MG PO TABS: ORAL_TABLET | ORAL | 0 refills | Status: AC

## 2019-05-10 MED ORDER — MELOXICAM 7.5 MG PO TABS: 7.5000 mg | ORAL_TABLET | Freq: Every day | ORAL | 2 refills | Status: AC

## 2019-05-10 NOTE — Evaluation (Signed)
Physical Therapy Evaluation Patient Details Name: John Kelley MRN: [id number] DOB: [birth year] Today's Date: 05/10/2019   History of Present Illness  Pt was at work and got his right leg caught between a pallet jack and a pallet. X-rays in the ED showed a tibia fx. Pt is now s/p right distal tibial intramedullary nailing. PMH includes I&D of wound with split thickness skin graft 2018.  Clinical Impression  The patient is a pleasant 29 yo who was in chair upon PT arrival, but eager to participate with PT. The pt was able to demonstrate good recall and maintenance of NWB status of RLE with all transfers, ambulation, and stairs. The patient was able to ambulate with use of bilateral crutches and supervision with a gait speed of 0.36ms. The patient had no LOB, good balance and functional use of WB limitations, and all questions regarding safe mobility at home were answered. At this time, recommend d/c with supervision of family until cleared by surgeon for [facility]    Follow Up Recommendations Outpatient PT(OPPT when cleared by surgeon)    Equipment Recommendations  Crutches    Recommendations for Other Services       Precautions / Restrictions Precautions Precautions: Fall Restrictions Weight Bearing Restrictions: Yes RLE Weight Bearing: Non weight bearing      Mobility  Bed Mobility Overal bed mobility: Needs Assistance Bed Mobility: Supine to Sit     Supine to sit: Supervision     General bed mobility comments: Pt in chair upon arrival, not assessed  Transfers Overall transfer level: Needs assistance Equipment used: Crutches Transfers: Sit to/from Stand Sit to Stand: Supervision         General transfer comment: sit-stand from recliner, pt able to demo good control and balance with use of crutches  Ambulation/Gait Ambulation/Gait assistance: Supervision Gait Distance (Feet): 75 Feet Assistive device: Crutches   Gait velocity: 0.764m Gait velocity  interpretation: 1.31 - 2.62 ft/sec, indicative of limited community ambulator General Gait Details: Pt ambulates with good balance and use of crutches, no LOB. Good gait speed (0.19m24m  Stairs Stairs: Yes Stairs assistance: Supervision;Min guard Stair Management: One rail Right;One rail Left(one rail, R going up. L going down) Number of Stairs: 5 General stair comments: Pt demos good balance and use of rail/crutches to safely navigate stairs while maintaining NWB status  Wheelchair Mobility    Modified Rankin (Stroke Patients Only)       Balance Overall balance assessment: Needs assistance Sitting-balance support: No upper extremity supported;Feet supported Sitting balance-Leahy Scale: Good     Standing balance support: Bilateral upper extremity supported Standing balance-Leahy Scale: Poor Standing balance comment: Pt uses BUE support with mobility to maintain NWB RLE, but is able to stand with single UE support with static standing                             Pertinent Vitals/Pain Pain Assessment: 0-10 Pain Score: 4  Pain Location: 4/10 at rest at the beginning of session, slightly increased with amb Pain Descriptors / Indicators: Guarding;Sore Pain Intervention(s): Repositioned;Monitored during session;RN gave pain meds during session    HomDazeypects to be discharged to:: Private residence Living Arrangements: Parent(mom) Available Help at Discharge: Family;Available PRN/intermittently Type of Home: Apartment Home Access: Stairs to enter Entrance Stairs-Rails: Left Entrance Stairs-Number of Steps: 2 sets of stairs 5 steps Home Layout: One level Home Equipment: Shower seat - built in;Walker - 2 wheels  Prior Function Level of Independence: Independent         Comments: works Camera operator Dominance   Dominant Hand: Right    Extremity/Trunk Assessment   Upper Extremity Assessment Upper Extremity  Assessment: Overall WFL for tasks assessed    Lower Extremity Assessment Lower Extremity Assessment: Overall WFL for tasks assessed;RLE deficits/detail RLE Deficits / Details: RLE not assessed due to NWB status, but was able to keep leg elevated during amb and stairs to maintain NWB    Cervical / Trunk Assessment Cervical / Trunk Assessment: Normal  Communication   Communication: No difficulties  Cognition Arousal/Alertness: Awake/alert Behavior During Therapy: WFL for tasks assessed/performed Overall Cognitive Status: Within Functional Limits for tasks assessed                                        General Comments      Exercises     Assessment/Plan    PT Assessment All further PT needs can be met in the next venue of care  PT Problem List Decreased mobility;Decreased activity tolerance;Pain       PT Treatment Interventions      PT Goals (Current goals can be found in the Care Plan section)  Acute Rehab PT Goals Patient Stated Goal: home PT Goal Formulation: With patient Time For Goal Achievement: 05/24/19 Potential to Achieve Goals: Good    Frequency     Barriers to discharge        Co-evaluation               AM-PAC PT "6 Clicks" Mobility  Outcome Measure Help needed turning from your back to your side while in a flat bed without using bedrails?: None Help needed moving from lying on your back to sitting on the side of a flat bed without using bedrails?: None Help needed moving to and from a bed to a chair (including a wheelchair)?: A Little Help needed standing up from a chair using your arms (e.g., wheelchair or bedside chair)?: A Little Help needed to walk in hospital room?: A Little Help needed climbing 3-5 steps with a railing? : A Little 6 Click Score: 20    End of Session Equipment Utilized During Treatment: Gait belt Activity Tolerance: Patient tolerated treatment well Patient left: in chair;with call bell/phone within  reach Nurse Communication: Mobility status PT Visit Diagnosis: Unsteadiness on feet (R26.81);Other abnormalities of gait and mobility (R26.89);Pain Pain - Right/Left: Right Pain - part of body: Leg    Time: 6333-5456 PT Time Calculation (min) (ACUTE ONLY): 27 min   Charges:   PT Evaluation $PT Eval Low Complexity: 1 Low PT Treatments $Gait Training: 8-22 mins       Mickey Farber, PT, DPT   Acute Rehabilitation Department [phone]

## 2019-05-10 NOTE — Evaluation (Signed)
Occupational Therapy Evaluation Patient Details Name: John Kelley MRN: [id number] DOB: 05-11-1991 Today's Date: 05/10/2019    History of Present Illness Pt was at work and got his right leg caught between a pallet jack and a pallet. X-rays in the ED showed a tibia fx. Pt is now s/p right distal tibial intramedullary nailing. PMH includes I&D of wound with splint thickness skin graft in 2018.   Clinical Impression   Pt admitted with the above diagnoses and presents with below problem list. Pt will benefit from continued acute OT to address the below listed deficits and maximize independence with basic ADLs prior to d/c home. PTA pt was independent with ADLs, works as a Lobbyist. Pt currently set up to min guard with ADLs. Tolerated session well.      Follow Up Recommendations  No OT follow up;Supervision - Intermittent    Equipment Recommendations  None recommended by OT    Recommendations for Other Services PT consult     Precautions / Restrictions Precautions Precautions: Fall Restrictions Weight Bearing Restrictions: Yes RLE Weight Bearing: Non weight bearing      Mobility Bed Mobility Overal bed mobility: Needs Assistance Bed Mobility: Supine to Sit     Supine to sit: Supervision     General bed mobility comments: supervision for safety  Transfers Overall transfer level: Needs assistance Equipment used: Rolling walker (2 wheeled) Transfers: Sit to/from Stand Sit to Stand: Min guard         General transfer comment: from EOB to recliner. Min guard for safety    Balance Overall balance assessment: Needs assistance Sitting-balance support: No upper extremity supported;Feet supported Sitting balance-Leahy Scale: Good     Standing balance support: Bilateral upper extremity supported Standing balance-Leahy Scale: Poor Standing balance comment: needs external support to maintain NWB RLE                           ADL either  performed or assessed with clinical judgement   ADL Overall ADL's : Needs assistance/impaired Eating/Feeding: Set up;Sitting   Grooming: Set up;Sitting   Upper Body Bathing: Set up;Sitting   Lower Body Bathing: Min guard;Sit to/from stand   Upper Body Dressing : Set up;Sitting   Lower Body Dressing: Min guard;Sit to/from stand   Toilet Transfer: Min guard;Ambulation;Comfort height toilet;Grab bars;RW   Toileting- Architect and Hygiene: Min guard;Sit to/from stand   Tub/ Shower Transfer: Tub transfer;Min guard;Ambulation;Shower seat;Rolling walker   Functional mobility during ADLs: Min guard;Rolling walker General ADL Comments: Pt completed in room functional mobility and functional transfers. LB ADL techqiques discussed.      Vision         Perception     Praxis      Pertinent Vitals/Pain Pain Assessment: 0-10 Pain Score: 7  Pain Location: RLE 7/10 walking, 6/10 at rest Pain Descriptors / Indicators: Guarding;Sore Pain Intervention(s): Monitored during session;Repositioned;Limited activity within patient's tolerance     Hand Dominance     Extremity/Trunk Assessment Upper Extremity Assessment Upper Extremity Assessment: Overall WFL for tasks assessed   Lower Extremity Assessment Lower Extremity Assessment: Defer to PT evaluation   Cervical / Trunk Assessment Cervical / Trunk Assessment: Normal   Communication Communication Communication: No difficulties   Cognition Arousal/Alertness: Awake/alert Behavior During Therapy: WFL for tasks assessed/performed Overall Cognitive Status: Within Functional Limits for tasks assessed  General Comments       Exercises     Shoulder Instructions      Home Living Family/patient expects to be discharged to:: Private residence Living Arrangements: Parent(mother) Available Help at Discharge: Family;Available PRN/intermittently Type of Home:  Apartment Home Access: Stairs to enter Entrance Stairs-Number of Steps: 2 sets of stairs 5 steps Entrance Stairs-Rails: Right;Left Home Layout: One level     Bathroom Shower/Tub: Teacher, early years/pre: Standard     Home Equipment: Shower seat - built in          Prior Functioning/Environment Level of Independence: Independent        Comments: works Psychologist, counselling Problem List: Impaired balance (sitting and/or standing);Decreased knowledge of use of DME or AE;Decreased knowledge of precautions;Pain      OT Treatment/Interventions: Self-care/ADL training;DME and/or AE instruction;Therapeutic activities;Patient/family education;Balance training    OT Goals(Current goals can be found in the care plan section) Acute Rehab OT Goals Patient Stated Goal: home OT Goal Formulation: With patient Time For Goal Achievement: 05/17/19 Potential to Achieve Goals: Good ADL Goals Pt Will Perform Tub/Shower Transfer: Tub transfer;with modified independence;shower seat;ambulating;rolling walker  OT Frequency: Min 2X/week   Barriers to D/C:            Co-evaluation              AM-PAC OT "6 Clicks" Daily Activity     Outcome Measure Help from another person eating meals?: None Help from another person taking care of personal grooming?: None Help from another person toileting, which includes using toliet, bedpan, or urinal?: None Help from another person bathing (including washing, rinsing, drying)?: A Little Help from another person to put on and taking off regular upper body clothing?: None Help from another person to put on and taking off regular lower body clothing?: None 6 Click Score: 23   End of Session Equipment Utilized During Treatment: Rolling walker Nurse Communication: Mobility status  Activity Tolerance: Patient tolerated treatment well Patient left: in chair;with call bell/phone within reach  OT Visit Diagnosis: Unsteadiness on  feet (R26.81);Pain Pain - Right/Left: Right Pain - part of body: Leg                Time: 3007-6226 OT Time Calculation (min): 25 min Charges:  OT General Charges $OT Visit: 1 Visit OT Evaluation $OT Eval Low Complexity: 1 Low OT Treatments $Self Care/Home Management : 8-22 mins  Tyrone Schimke, OT Acute Rehabilitation Services Pager: [phone] Office: [phone]   Hortencia Pilar 05/10/2019, 9:56 AM

## 2019-05-10 NOTE — Anesthesia Postprocedure Evaluation (Signed)
Anesthesia Post Note  Patient: John Kelley  Procedure(s) Performed: Intramedullary (Im) Nail Tibial (Right Leg Lower)     Patient location during evaluation: PACU Anesthesia Type: General Level of consciousness: awake and alert Pain management: pain level controlled Vital Signs Assessment: post-procedure vital signs reviewed and stable Respiratory status: spontaneous breathing, nonlabored ventilation, respiratory function stable and patient connected to nasal cannula oxygen Cardiovascular status: blood pressure returned to baseline and stable Postop Assessment: no apparent nausea or vomiting Anesthetic complications: no    Last Vitals:  Vitals:   05/10/19 0355 05/10/19 0801  BP: (!) 135/91 (!) 136/93  Pulse: 70 71  Resp: 18 18  Temp: 36.7 C 36.9 C  SpO2: 98% 99%    Last Pain:  Vitals:   05/10/19 0900  TempSrc:   PainSc: 1                  Ardena Gangl

## 2019-05-10 NOTE — Discharge Instructions (Signed)

## 2019-05-10 NOTE — H&P (Signed)
Please see [facility] consult note from 05/09/2019 as a H&P.

## 2019-05-10 NOTE — TOC Initial Note (Signed)
Transition of Care [facility]) - Initial/Assessment Note    Patient Details  Name: John Kelley MRN: [id number] Date of Birth: 20-Nov-1990  [facility]) CM/SW Contact:    Kingsley Plan, RN Phone Number: 05/10/2019, 12:33 PM  Clinical Narrative:                 Patient from home with mother.  Spoke to Workers comp Sports coach *( Angelique Blonder Froust with [employer] [phone] fax [phone]) via speaker phone at patient bedside.   PT recommendations OP PT when cleared by MD. Called Dr Everardo Pacific office to clarify when he would like Workers comp Sports coach to arrange. Awaiting call back. Provoided office with Ms Luretha Murphy contact information.  Also recommendation of Crutches . Bedside nurse to order from portable.  [facility]'s scripts [facility] [address]. Ms Luretha Murphy advised patient when [facility] gets to [facility] to call her and she will provide information [facility] will need to bill Workers Comp.  Patient to make follow up appointment for 10/27 Ms Luretha Murphy will arrange and let patient know time of appointment and provide transportation to appointment. Ms Luretha Murphy requesting NCM to fax clinicals to her. Patient consented and signed release of information form.    Patient's mother will transport him home today .  Expected Discharge Plan: Home/Self Care Barriers to Discharge: No Barriers Identified   Patient Goals and CMS Choice Patient states their goals for this hospitalization and ongoing recovery are:: to go home CMS Medicare.gov Compare Post Acute Care list provided to:: Patient Choice offered to / list presented to : NA  Expected Discharge Plan and Services Expected Discharge Plan: Home/Self Care   Discharge Planning Services: CM Consult   Living arrangements for the past 2 months: Single Family Home Expected Discharge Date: 05/10/19               DME Arranged: N/A         HH Arranged: NA          Prior Living Arrangements/Services Living arrangements for the  past 2 months: Single Family Home Lives with:: Parents Patient language and need for interpreter reviewed:: Yes        Need for Family Participation in Patient Care: Yes (Comment) Care giver support system in place?: Yes (comment)   Criminal Activity/Legal Involvement Pertinent to Current Situation/Hospitalization: No - Comment as needed  Activities of Daily Living Home Assistive Devices/Equipment: Crutches ADL Screening (condition at time of admission) Patient's cognitive ability adequate to safely complete daily activities?: Yes Is the patient deaf or have difficulty hearing?: No Does the patient have difficulty seeing, even when wearing glasses/contacts?: No Does the patient have difficulty concentrating, remembering, or making decisions?: No Patient able to express need for assistance with ADLs?: Yes Does the patient have difficulty dressing or bathing?: No Independently performs ADLs?: No Communication: Independent Dressing (OT): Independent Grooming: Independent Feeding: Independent Bathing: Independent Toileting: Dependent Is this a change from baseline?: Change from baseline, expected to last >3days In/Out Bed: Dependent Is this a change from baseline?: Change from baseline, expected to last >3 days Walks in Home: Dependent Is this a change from baseline?: Change from baseline, expected to last >3 days Does the patient have difficulty walking or climbing stairs?: Yes Weakness of Legs: Right Weakness of Arms/Hands: None  Permission Sought/Granted   Permission granted to share information with : Yes, Verbal Permission Granted  Share Information with NAME: Angelique Blonder froust [id number]  Permission granted to  share info w AGENCY: [employer]  Permission granted to share info w Relationship: Workers comp [place] Appearance:: Appears stated age Attitude/Demeanor/Rapport: Engaged Affect (typically observed): Accepting Orientation: : Oriented to  Time,  Oriented to Situation, Oriented to Place, Oriented to Self Alcohol / Substance Use: Not Applicable Psych Involvement: No (comment)  Admission diagnosis:  Closed nondisplaced comminuted fracture of shaft of right tibia, initial encounter [S82.254A] Patient Active Problem List   Diagnosis Date Noted  . Tibia/fibula fracture, right, closed, initial encounter 05/08/2019  . Unspecified fracture of left foot, initial encounter for open fracture 01/08/2017   PCP:  Patient, No Pcp Per Pharmacy:   [id number] [place], [place] [address] [place] [address] Phone: [phone] Fax: [phone]     Social Determinants of Health (SDOH) Interventions    Readmission Risk Interventions No flowsheet data found.

## 2019-05-10 NOTE — Care Management (Signed)
Workers Comp Tourist information centre manager is Claretta Fraise phone [phone] fax [phone]  Once PT eval and recommendations made and orders received will fax to Ms Lois Huxley and she will arrange any Home health or DME    Magdalen Spatz RN [phone]

## 2019-05-10 NOTE — Discharge Summary (Signed)
Patient ID: John Kelley MRN: [id number] DOB/AGE: [birth year] 29 y.o.  Admit date: 05/08/2019 Discharge date: 05/10/2019  Admission Diagnoses: Right distal tibial crush injury with extra-articular fracture  Discharge Diagnoses:  Active Problems:   Tibia/fibula fracture, right, closed, initial encounter   Past Medical History:  Diagnosis Date  . Open wound of left foot 02/2017   Procedures Performed: Right distal tibial intramedullary nailing  Discharged Condition: good/stable  Hospital Course: Patient presented to the ED on 05/08/2019 after injury to the right shin while at work, had a pallet pressed into the right lower leg, and sustained a comminuted and impacted fracture of the distal tibia, no evidence of intra-articular extension. Orthopedics was consulted. Patient required surgical repair. He was admitted to the hospital. Right distal tibial intramedullary nailing was performed on 10/21 by Dr. Griffin Basil. Patient tolerated the procedure well. He was kept overnight for close monitoring, pain control, and PT/OT evaluation. Patient was found to be stable for discharge.   Patient was instructed on specific activity restrictions and all questions were answered.   Consults: PT / OT  Significant Diagnostic Studies: No additional pertinent studies  Treatments: Right distal tibial intramedullary nailing  Discharge Exam: Dressing CDI. ROM not tested in setting of recent surgery. Healing fracture blisters present on distal calf, with no signs of active drainage. Sensation intact distally. Patient is able to move his toes. Warm well perfused foot. Compartments soft and compressible. No pain with passive stretch.   Disposition: Discharge disposition: 01-Home or Self Care     Patient to be discharged home with outpatient PT. Follow-up next week in clinic for incision check and x-ray.   He was counseled on the importance of remaining NWB in boot.   Pain medications: scheduled  Tylenol and Mobic. PRN Oxycodone. Minimize narcotics as able. DVT prophylaxis: Xarelto Prescriptions sent electronically to patient's local pharmacy.   Discharge Instructions    Call MD for:  redness, tenderness, or signs of infection (pain, swelling, redness, odor or green/yellow discharge around incision site)   Complete by: As directed    Call MD for:  severe uncontrolled pain   Complete by: As directed    Call MD for:  temperature >100.4   Complete by: As directed    Diet - low sodium heart healthy   Complete by: As directed    Discharge instructions   Complete by: As directed    Ophelia Charter MD, MPH [place]. [address] [phone] (tel)   [phone] (fax)   [place] may leave the operative dressing in place until your follow-up appointment  - KEEP THE INCISIONS CLEAN AND DRY.  - You may shower on Post-Op Day #2. The dressing is water resistant but do not scrub it as it may start to peel up.  Gently pat the area dry. Do not soak the shoulder in water. Do not go swimming in the pool or ocean until your sutures are removed.  EXERCISES Do not put any weight on your right lower leg.  Please use crutches or a walker to avoid weight bearing!  FOLLOW-UP If you develop a Fever (>101.5), Redness or Drainage from the surgical incision site, please call our office to arrange for an evaluation. Please call the office to schedule a follow-up appointment for your incision check if you do not already have one, 7-10 days post-operatively.  IF YOU HAVE ANY QUESTIONS, PLEASE FEEL FREE TO CALL OUR OFFICE.  HELPFUL  INFORMATION You should wean off your narcotic medicines as soon as you are able.  Most patients will be off or using minimal narcotics before their first postop appointment.   We suggest you use the pain medication the first night prior to going to bed, in order to ease any pain when the anesthesia  wears off. You should avoid taking pain medications on an empty stomach as it will make you nauseous.  Do not drink alcoholic beverages or take illicit drugs when taking pain medications.  In most states it is against the law to drive while you are in a splint or sling.  And certainly against the law to drive while taking narcotics.  You may return to work/school in the next couple of days when you feel up to it.    Pain medication may make you constipated.  Below are a few solutions to try in this order: Decrease the amount of pain medication if you aren't having pain. Drink lots of decaffeinated fluids. Drink prune juice and/or each dried prunes  If the first 3 don't work start with additional solutions Take Colace - an over-the-counter stool softener Take Senokot - an over-the-counter laxative Take Miralax - a stronger over-the-counter laxative   Leave dressing on - Keep it clean, dry, and intact until clinic visit   Complete by: As directed      Allergies as of 05/10/2019   No Known Allergies     Medication List    STOP taking these medications   aspirin EC 325 MG tablet   docusate sodium 100 MG capsule Commonly known as: Colace   methocarbamol 500 MG tablet Commonly known as: Robaxin     TAKE these medications   acetaminophen 500 MG tablet Commonly known as: TYLENOL Take 2 tablets (1,000 mg total) by mouth every 8 (eight) hours for 14 days.   meloxicam 7.5 MG tablet Commonly known as: Mobic Take 1 tablet (7.5 mg total) by mouth daily.   oxyCODONE 5 MG immediate release tablet Commonly known as: Oxy IR/ROXICODONE Take 1-2 pills every 6 hrs as needed for pain, no more than 6 per day   rivaroxaban 10 MG Tabs tablet Commonly known as: Xarelto Take 1 tablet (10 mg total) by mouth daily.      Follow-up Information    Bjorn Pippin, MD. Schedule an appointment as soon as possible for a visit on 05/15/2019.   Specialty: Orthopedic Surgery Why: For first  appointment after surgery Contact information: [address] [place] [address] [phone]

## 2019-05-10 NOTE — Plan of Care (Signed)

## 2019-05-10 NOTE — Progress Notes (Signed)
Orthopedic Tech Progress Note Patient Details:  John Kelley 1991-06-01 [id number]  Ortho Devices Type of Ortho Device: Crutches Splint Material: Fiberglass Ortho Device/Splint Location: Rt leg Ortho Device/Splint Interventions: Adjustment   Post Interventions Patient Tolerated: Well Instructions Provided: Care of device   [place] Pink 05/10/2019, 1:14 PM

## 2021-01-14 ENCOUNTER — Inpatient Hospital Stay (HOSPITAL_COMMUNITY)
Admission: EM | Admit: 2021-01-14 | Discharge: 2021-01-16 | DRG: 481 | Disposition: A | Discharge status: Home / Self Care | Payer: Managed Care, Other (non HMO) | Attending: General Surgery | Admitting: General Surgery

## 2021-01-14 ENCOUNTER — Emergency Department (HOSPITAL_COMMUNITY): Payer: Managed Care, Other (non HMO)

## 2021-01-14 ENCOUNTER — Other Ambulatory Visit: Payer: Self-pay

## 2021-01-14 ENCOUNTER — Encounter (HOSPITAL_COMMUNITY): Payer: Self-pay

## 2021-01-14 DIAGNOSIS — S72432A Displaced fracture of medial condyle of left femur, initial encounter for closed fracture: Principal | ICD-10-CM | POA: Diagnosis present

## 2021-01-14 DIAGNOSIS — S72413A Displaced unspecified condyle fracture of lower end of unspecified femur, initial encounter for closed fracture: Secondary | ICD-10-CM

## 2021-01-14 DIAGNOSIS — Y907 Blood alcohol level of 200-239 mg/100 ml: Secondary | ICD-10-CM | POA: Diagnosis present

## 2021-01-14 DIAGNOSIS — S7290XA Unspecified fracture of unspecified femur, initial encounter for closed fracture: Secondary | ICD-10-CM | POA: Diagnosis present

## 2021-01-14 DIAGNOSIS — Z7901 Long term (current) use of anticoagulants: Secondary | ICD-10-CM

## 2021-01-14 DIAGNOSIS — S72402A Unspecified fracture of lower end of left femur, initial encounter for closed fracture: Secondary | ICD-10-CM

## 2021-01-14 DIAGNOSIS — Z419 Encounter for procedure for purposes other than remedying health state, unspecified: Secondary | ICD-10-CM

## 2021-01-14 DIAGNOSIS — F1010 Alcohol abuse, uncomplicated: Secondary | ICD-10-CM | POA: Diagnosis present

## 2021-01-14 DIAGNOSIS — Z20822 Contact with and (suspected) exposure to covid-19: Secondary | ICD-10-CM | POA: Diagnosis present

## 2021-01-14 DIAGNOSIS — T148XXA Other injury of unspecified body region, initial encounter: Secondary | ICD-10-CM

## 2021-01-14 DIAGNOSIS — S01511A Laceration without foreign body of lip, initial encounter: Secondary | ICD-10-CM | POA: Diagnosis present

## 2021-01-14 DIAGNOSIS — T1490XA Injury, unspecified, initial encounter: Secondary | ICD-10-CM

## 2021-01-14 DIAGNOSIS — D62 Acute posthemorrhagic anemia: Secondary | ICD-10-CM | POA: Diagnosis not present

## 2021-01-14 DIAGNOSIS — S0232XA Fracture of orbital floor, left side, initial encounter for closed fracture: Secondary | ICD-10-CM | POA: Diagnosis present

## 2021-01-14 DIAGNOSIS — Z87891 Personal history of nicotine dependence: Secondary | ICD-10-CM
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
## 2021-01-15 ENCOUNTER — Emergency Department (HOSPITAL_COMMUNITY): Payer: Managed Care, Other (non HMO)

## 2021-01-15 ENCOUNTER — Encounter (HOSPITAL_COMMUNITY): Admission: EM | Disposition: A | Discharge status: Home / Self Care | Payer: Self-pay | Source: Home / Self Care

## 2021-01-15 ENCOUNTER — Inpatient Hospital Stay (HOSPITAL_COMMUNITY): Payer: Managed Care, Other (non HMO)

## 2021-01-15 ENCOUNTER — Inpatient Hospital Stay (HOSPITAL_COMMUNITY): Payer: Managed Care, Other (non HMO) | Admitting: Anesthesiology

## 2021-01-15 ENCOUNTER — Encounter (HOSPITAL_COMMUNITY): Payer: Self-pay

## 2021-01-15 DIAGNOSIS — Z7901 Long term (current) use of anticoagulants: Secondary | ICD-10-CM | POA: Diagnosis not present

## 2021-01-15 DIAGNOSIS — S72413A Displaced unspecified condyle fracture of lower end of unspecified femur, initial encounter for closed fracture: Secondary | ICD-10-CM

## 2021-01-15 DIAGNOSIS — F1010 Alcohol abuse, uncomplicated: Secondary | ICD-10-CM | POA: Diagnosis present

## 2021-01-15 DIAGNOSIS — Y907 Blood alcohol level of 200-239 mg/100 ml: Secondary | ICD-10-CM | POA: Diagnosis present

## 2021-01-15 DIAGNOSIS — S72432A Displaced fracture of medial condyle of left femur, initial encounter for closed fracture: Secondary | ICD-10-CM | POA: Diagnosis present

## 2021-01-15 DIAGNOSIS — S0232XA Fracture of orbital floor, left side, initial encounter for closed fracture: Secondary | ICD-10-CM | POA: Diagnosis present

## 2021-01-15 DIAGNOSIS — Z20822 Contact with and (suspected) exposure to covid-19: Secondary | ICD-10-CM | POA: Diagnosis present

## 2021-01-15 DIAGNOSIS — S01511A Laceration without foreign body of lip, initial encounter: Secondary | ICD-10-CM | POA: Diagnosis present

## 2021-01-15 DIAGNOSIS — D62 Acute posthemorrhagic anemia: Secondary | ICD-10-CM | POA: Diagnosis not present

## 2021-01-15 DIAGNOSIS — S7290XA Unspecified fracture of unspecified femur, initial encounter for closed fracture: Secondary | ICD-10-CM | POA: Diagnosis present

## 2021-01-15 DIAGNOSIS — Z87891 Personal history of nicotine dependence: Secondary | ICD-10-CM | POA: Diagnosis not present

## 2021-01-15 HISTORY — PX: ORIF FEMUR FRACTURE: SHX2119

## 2021-01-15 LAB — CBC
HCT: 36.1 % — ABNORMAL LOW (ref 39.0–52.0)
Hemoglobin: 11.9 g/dL — ABNORMAL LOW (ref 13.0–17.0)
MCH: 34.5 pg — ABNORMAL HIGH (ref 26.0–34.0)
MCHC: 33 g/dL (ref 30.0–36.0)
MCV: 104.6 fL — ABNORMAL HIGH (ref 80.0–100.0)
Platelets: 184 10*3/uL (ref 150–400)
RBC: 3.45 MIL/uL — ABNORMAL LOW (ref 4.22–5.81)
RDW: 12 % (ref 11.5–15.5)
WBC: 12.6 10*3/uL — ABNORMAL HIGH (ref 4.0–10.5)
nRBC: 0 % (ref 0.0–0.2)

## 2021-01-15 LAB — CBC WITH DIFFERENTIAL/PLATELET
Abs Immature Granulocytes: 0.03 10*3/uL (ref 0.00–0.07)
Basophils Absolute: 0.1 10*3/uL (ref 0.0–0.1)
Basophils Relative: 1 %
Eosinophils Absolute: 0.1 10*3/uL (ref 0.0–0.5)
Eosinophils Relative: 1 %
HCT: 40 % (ref 39.0–52.0)
Hemoglobin: 13.1 g/dL (ref 13.0–17.0)
Immature Granulocytes: 0 %
Lymphocytes Relative: 22 %
Lymphs Abs: 2 10*3/uL (ref 0.7–4.0)
MCH: 35 pg — ABNORMAL HIGH (ref 26.0–34.0)
MCHC: 32.8 g/dL (ref 30.0–36.0)
MCV: 107 fL — ABNORMAL HIGH (ref 80.0–100.0)
Monocytes Absolute: 0.8 10*3/uL (ref 0.1–1.0)
Monocytes Relative: 9 %
Neutro Abs: 6 10*3/uL (ref 1.7–7.7)
Neutrophils Relative %: 67 %
Platelets: 188 10*3/uL (ref 150–400)
RBC: 3.74 MIL/uL — ABNORMAL LOW (ref 4.22–5.81)
RDW: 11.9 % (ref 11.5–15.5)
WBC: 8.9 10*3/uL (ref 4.0–10.5)
nRBC: 0 % (ref 0.0–0.2)

## 2021-01-15 LAB — SARS CORONAVIRUS 2 (TAT 6-24 HRS): SARS Coronavirus 2: NEGATIVE

## 2021-01-15 LAB — I-STAT CHEM 8, ED
BUN: 9 mg/dL (ref 6–20)
Calcium, Ion: 0.98 mmol/L — ABNORMAL LOW (ref 1.15–1.40)
Chloride: 108 mmol/L (ref 98–111)
Creatinine, Ser: 1.2 mg/dL (ref 0.61–1.24)
Glucose, Bld: 107 mg/dL — ABNORMAL HIGH (ref 70–99)
HCT: 41 % (ref 39.0–52.0)
Hemoglobin: 13.9 g/dL (ref 13.0–17.0)
Potassium: 3.7 mmol/L (ref 3.5–5.1)
Sodium: 144 mmol/L (ref 135–145)
TCO2: 23 mmol/L (ref 22–32)

## 2021-01-15 LAB — SURGICAL PCR SCREEN
MRSA, PCR: NEGATIVE
Staphylococcus aureus: NEGATIVE

## 2021-01-15 LAB — COMPREHENSIVE METABOLIC PANEL
ALT: 24 U/L (ref 0–44)
AST: 47 U/L — ABNORMAL HIGH (ref 15–41)
Albumin: 3.7 g/dL (ref 3.5–5.0)
Alkaline Phosphatase: 76 U/L (ref 38–126)
Anion gap: 8 (ref 5–15)
BUN: 8 mg/dL (ref 6–20)
CO2: 23 mmol/L (ref 22–32)
Calcium: 8.2 mg/dL — ABNORMAL LOW (ref 8.9–10.3)
Chloride: 109 mmol/L (ref 98–111)
Creatinine, Ser: 0.94 mg/dL (ref 0.61–1.24)
GFR, Estimated: >60 mL/min (ref >60)
Glucose, Bld: 114 mg/dL — ABNORMAL HIGH (ref 70–99)
Potassium: 3.9 mmol/L (ref 3.5–5.1)
Sodium: 140 mmol/L (ref 135–145)
Total Bilirubin: 0.4 mg/dL (ref 0.3–1.2)
Total Protein: 6 g/dL — ABNORMAL LOW (ref 6.5–8.1)

## 2021-01-15 LAB — HIV ANTIBODY (ROUTINE TESTING W REFLEX): HIV Screen 4th Generation wRfx: NONREACTIVE

## 2021-01-15 LAB — MAGNESIUM: Magnesium: 1.9 mg/dL (ref 1.7–2.4)

## 2021-01-15 LAB — BASIC METABOLIC PANEL
Anion gap: 9 (ref 5–15)
BUN: 7 mg/dL (ref 6–20)
CO2: 24 mmol/L (ref 22–32)
Calcium: 8.6 mg/dL — ABNORMAL LOW (ref 8.9–10.3)
Chloride: 107 mmol/L (ref 98–111)
Creatinine, Ser: 1.02 mg/dL (ref 0.61–1.24)
GFR, Estimated: >60 mL/min (ref >60)
Glucose, Bld: 109 mg/dL — ABNORMAL HIGH (ref 70–99)
Potassium: 3.9 mmol/L (ref 3.5–5.1)
Sodium: 140 mmol/L (ref 135–145)

## 2021-01-15 LAB — PROTIME-INR
INR: 0.9 (ref 0.8–1.2)
Prothrombin Time: 12.5 seconds (ref 11.4–15.2)

## 2021-01-15 LAB — ETHANOL: Alcohol, Ethyl (B): 230 mg/dL — ABNORMAL HIGH (ref <10)

## 2021-01-15 LAB — PHOSPHORUS: Phosphorus: 4.2 mg/dL (ref 2.5–4.6)

## 2021-01-15 SURGERY — OPEN REDUCTION INTERNAL FIXATION (ORIF) DISTAL FEMUR FRACTURE
Anesthesia: General | Laterality: Left

## 2021-01-15 MED ORDER — CHLORHEXIDINE GLUCONATE 4 % EX LIQD: 60.0000 mL | Freq: Once | CUTANEOUS | Status: DC

## 2021-01-15 MED ORDER — OXYCODONE HCL 5 MG PO TABS
10.0000 mg | ORAL_TABLET | ORAL | Status: DC | PRN
  Administered 2021-01-15 (×2): 10 mg via ORAL
  Filled 2021-01-15 (×2): qty 2

## 2021-01-15 MED ORDER — LIDOCAINE HCL (PF) 2 % IJ SOLN
INTRAMUSCULAR | Status: DC | PRN
  Administered 2021-01-15: 60 mg via INTRADERMAL

## 2021-01-15 MED ORDER — 0.9 % SODIUM CHLORIDE (POUR BTL) OPTIME
TOPICAL | Status: DC | PRN
  Administered 2021-01-15: 1000 mL

## 2021-01-15 MED ORDER — MIDAZOLAM HCL 2 MG/2ML IJ SOLN
INTRAMUSCULAR | Status: DC | PRN
  Administered 2021-01-15: 2 mg via INTRAVENOUS

## 2021-01-15 MED ORDER — METHOCARBAMOL 500 MG PO TABS: 500.0000 mg | ORAL_TABLET | Freq: Four times a day (QID) | ORAL | Status: DC | PRN

## 2021-01-15 MED ORDER — LACTATED RINGERS IV SOLN: INTRAVENOUS | Status: DC

## 2021-01-15 MED ORDER — ONDANSETRON HCL 4 MG/2ML IJ SOLN: 4.0000 mg | Freq: Four times a day (QID) | INTRAMUSCULAR | Status: DC | PRN

## 2021-01-15 MED ORDER — FENTANYL CITRATE (PF) 100 MCG/2ML IJ SOLN
INTRAMUSCULAR | Status: AC
  Filled 2021-01-15: qty 2

## 2021-01-15 MED ORDER — POVIDONE-IODINE 10 % EX SWAB: 2.0000 "application " | Freq: Once | CUTANEOUS | Status: DC

## 2021-01-15 MED ORDER — VANCOMYCIN HCL 1000 MG IV SOLR
INTRAVENOUS | Status: AC
  Filled 2021-01-15: qty 1000

## 2021-01-15 MED ORDER — METHOCARBAMOL 1000 MG/10ML IJ SOLN
500.0000 mg | Freq: Four times a day (QID) | INTRAVENOUS | Status: DC | PRN
  Filled 2021-01-15 (×2): qty 5

## 2021-01-15 MED ORDER — DEXMEDETOMIDINE (PRECEDEX) IN NS 20 MCG/5ML (4 MCG/ML) IV SYRINGE
PREFILLED_SYRINGE | INTRAVENOUS | Status: DC | PRN
  Administered 2021-01-15 (×2): 8 ug via INTRAVENOUS
  Administered 2021-01-15: 4 ug via INTRAVENOUS

## 2021-01-15 MED ORDER — DEXAMETHASONE SODIUM PHOSPHATE 10 MG/ML IJ SOLN
INTRAMUSCULAR | Status: AC
  Filled 2021-01-15: qty 1

## 2021-01-15 MED ORDER — ONDANSETRON HCL 4 MG PO TABS: 4.0000 mg | ORAL_TABLET | Freq: Four times a day (QID) | ORAL | Status: DC | PRN

## 2021-01-15 MED ORDER — ROCURONIUM BROMIDE 10 MG/ML (PF) SYRINGE
PREFILLED_SYRINGE | INTRAVENOUS | Status: DC | PRN
  Administered 2021-01-15 (×2): 20 mg via INTRAVENOUS
  Administered 2021-01-15: 60 mg via INTRAVENOUS

## 2021-01-15 MED ORDER — THIAMINE HCL 100 MG PO TABS
100.0000 mg | ORAL_TABLET | Freq: Every day | ORAL | Status: DC
  Administered 2021-01-15 – 2021-01-16 (×2): 100 mg via ORAL
  Filled 2021-01-15 (×2): qty 1

## 2021-01-15 MED ORDER — SIMETHICONE 80 MG PO CHEW: 80.0000 mg | CHEWABLE_TABLET | Freq: Four times a day (QID) | ORAL | Status: DC | PRN

## 2021-01-15 MED ORDER — FENTANYL CITRATE (PF) 100 MCG/2ML IJ SOLN
INTRAMUSCULAR | Status: DC | PRN
  Administered 2021-01-15 (×5): 50 ug via INTRAVENOUS

## 2021-01-15 MED ORDER — CHLORHEXIDINE GLUCONATE 0.12 % MT SOLN
OROMUCOSAL | Status: AC
  Administered 2021-01-15: 15 mL via OROMUCOSAL
  Filled 2021-01-15: qty 15

## 2021-01-15 MED ORDER — CEFAZOLIN SODIUM-DEXTROSE 2-4 GM/100ML-% IV SOLN
2.0000 g | INTRAVENOUS | Status: AC
  Administered 2021-01-15: 2 g via INTRAVENOUS
  Filled 2021-01-15: qty 100

## 2021-01-15 MED ORDER — FENTANYL CITRATE (PF) 250 MCG/5ML IJ SOLN
INTRAMUSCULAR | Status: AC
  Filled 2021-01-15: qty 5

## 2021-01-15 MED ORDER — IOHEXOL 300 MG/ML  SOLN
100.0000 mL | Freq: Once | INTRAMUSCULAR | Status: AC | PRN
  Administered 2021-01-15: 100 mL via INTRAVENOUS

## 2021-01-15 MED ORDER — METHOCARBAMOL 1000 MG/10ML IJ SOLN
500.0000 mg | Freq: Four times a day (QID) | INTRAMUSCULAR | Status: DC | PRN
  Filled 2021-01-15: qty 5

## 2021-01-15 MED ORDER — LORAZEPAM 1 MG PO TABS: 1.0000 mg | ORAL_TABLET | ORAL | Status: DC | PRN

## 2021-01-15 MED ORDER — DOCUSATE SODIUM 100 MG PO CAPS
100.0000 mg | ORAL_CAPSULE | Freq: Two times a day (BID) | ORAL | Status: DC
  Administered 2021-01-15 (×2): 100 mg via ORAL
  Filled 2021-01-15 (×2): qty 1

## 2021-01-15 MED ORDER — ADULT MULTIVITAMIN W/MINERALS CH
1.0000 | ORAL_TABLET | Freq: Every day | ORAL | Status: DC
  Administered 2021-01-15 – 2021-01-16 (×2): 1 via ORAL
  Filled 2021-01-15 (×2): qty 1

## 2021-01-15 MED ORDER — CHLORHEXIDINE GLUCONATE 0.12 % MT SOLN: 15.0000 mL | OROMUCOSAL | Status: AC

## 2021-01-15 MED ORDER — METOCLOPRAMIDE HCL 5 MG/ML IJ SOLN
10.0000 mg | Freq: Four times a day (QID) | INTRAMUSCULAR | Status: DC
  Administered 2021-01-15: 10 mg via INTRAVENOUS
  Filled 2021-01-15 (×2): qty 2

## 2021-01-15 MED ORDER — HYDROMORPHONE HCL 1 MG/ML IJ SOLN
0.5000 mg | INTRAMUSCULAR | Status: DC | PRN
  Administered 2021-01-16 (×2): 1 mg via INTRAVENOUS
  Filled 2021-01-15 (×2): qty 1

## 2021-01-15 MED ORDER — LIDOCAINE HCL (PF) 1 % IJ SOLN
5.0000 mL | Freq: Once | INTRAMUSCULAR | Status: AC
  Administered 2021-01-15: 5 mL via INTRADERMAL
  Filled 2021-01-15: qty 5

## 2021-01-15 MED ORDER — PROMETHAZINE HCL 25 MG/ML IJ SOLN: 6.2500 mg | INTRAMUSCULAR | Status: DC | PRN

## 2021-01-15 MED ORDER — HYDROMORPHONE HCL 1 MG/ML IJ SOLN
1.0000 mg | INTRAMUSCULAR | Status: DC | PRN
  Administered 2021-01-15: 1 mg via INTRAVENOUS
  Filled 2021-01-15: qty 1

## 2021-01-15 MED ORDER — VANCOMYCIN HCL 1000 MG IV SOLR
INTRAVENOUS | Status: DC | PRN
  Administered 2021-01-15: 1000 mg

## 2021-01-15 MED ORDER — ENOXAPARIN SODIUM 40 MG/0.4ML IJ SOSY
40.0000 mg | PREFILLED_SYRINGE | INTRAMUSCULAR | Status: DC
  Administered 2021-01-16: 40 mg via SUBCUTANEOUS
  Filled 2021-01-15: qty 0.4

## 2021-01-15 MED ORDER — ONDANSETRON HCL 4 MG/2ML IJ SOLN
INTRAMUSCULAR | Status: DC | PRN
  Administered 2021-01-15: 4 mg via INTRAVENOUS

## 2021-01-15 MED ORDER — ROCURONIUM BROMIDE 10 MG/ML (PF) SYRINGE
PREFILLED_SYRINGE | INTRAVENOUS | Status: AC
  Filled 2021-01-15: qty 10

## 2021-01-15 MED ORDER — POLYETHYLENE GLYCOL 3350 17 G PO PACK: 17.0000 g | PACK | Freq: Every day | ORAL | Status: DC | PRN

## 2021-01-15 MED ORDER — ENOXAPARIN SODIUM 30 MG/0.3ML IJ SOSY: 30.0000 mg | PREFILLED_SYRINGE | Freq: Two times a day (BID) | INTRAMUSCULAR | Status: DC

## 2021-01-15 MED ORDER — DOCUSATE SODIUM 100 MG PO CAPS
100.0000 mg | ORAL_CAPSULE | Freq: Two times a day (BID) | ORAL | Status: DC
  Administered 2021-01-16: 100 mg via ORAL
  Filled 2021-01-15: qty 1

## 2021-01-15 MED ORDER — DEXAMETHASONE SODIUM PHOSPHATE 10 MG/ML IJ SOLN
INTRAMUSCULAR | Status: DC | PRN
  Administered 2021-01-15: 5 mg via INTRAVENOUS

## 2021-01-15 MED ORDER — FENTANYL CITRATE (PF) 100 MCG/2ML IJ SOLN
25.0000 ug | INTRAMUSCULAR | Status: DC | PRN
  Administered 2021-01-15: 50 ug via INTRAVENOUS

## 2021-01-15 MED ORDER — CEFAZOLIN SODIUM-DEXTROSE 2-4 GM/100ML-% IV SOLN
2.0000 g | Freq: Three times a day (TID) | INTRAVENOUS | Status: AC
  Administered 2021-01-16 (×3): 2 g via INTRAVENOUS
  Filled 2021-01-15 (×3): qty 100

## 2021-01-15 MED ORDER — OXYCODONE HCL 5 MG PO TABS
5.0000 mg | ORAL_TABLET | ORAL | Status: DC | PRN
Start: 2021-01-15 — End: 2021-01-17
  Administered 2021-01-16 (×2): 10 mg via ORAL
  Filled 2021-01-15 (×2): qty 2

## 2021-01-15 MED ORDER — MIDAZOLAM HCL 2 MG/2ML IJ SOLN
INTRAMUSCULAR | Status: AC
  Filled 2021-01-15: qty 2

## 2021-01-15 MED ORDER — ONDANSETRON HCL 4 MG/2ML IJ SOLN
INTRAMUSCULAR | Status: AC
  Filled 2021-01-15: qty 2

## 2021-01-15 MED ORDER — METOCLOPRAMIDE HCL 5 MG/ML IJ SOLN: 5.0000 mg | Freq: Three times a day (TID) | INTRAMUSCULAR | Status: DC | PRN

## 2021-01-15 MED ORDER — THIAMINE HCL 100 MG/ML IJ SOLN: 100.0000 mg | Freq: Every day | INTRAMUSCULAR | Status: DC

## 2021-01-15 MED ORDER — LIDOCAINE 2% (20 MG/ML) 5 ML SYRINGE
INTRAMUSCULAR | Status: AC
  Filled 2021-01-15: qty 5

## 2021-01-15 MED ORDER — OXYCODONE HCL 5 MG PO TABS
5.0000 mg | ORAL_TABLET | ORAL | Status: DC | PRN
  Administered 2021-01-15: 5 mg via ORAL
  Filled 2021-01-15: qty 1

## 2021-01-15 MED ORDER — FOLIC ACID 1 MG PO TABS
1.0000 mg | ORAL_TABLET | Freq: Every day | ORAL | Status: DC
  Administered 2021-01-15: 1 mg via ORAL
  Filled 2021-01-15: qty 1

## 2021-01-15 MED ORDER — SUGAMMADEX SODIUM 200 MG/2ML IV SOLN
INTRAVENOUS | Status: DC | PRN
  Administered 2021-01-15: 300 mg via INTRAVENOUS

## 2021-01-15 MED ORDER — ACETAMINOPHEN 325 MG PO TABS
650.0000 mg | ORAL_TABLET | Freq: Four times a day (QID) | ORAL | Status: DC
  Administered 2021-01-15 (×2): 650 mg via ORAL
  Filled 2021-01-15 (×2): qty 2

## 2021-01-15 MED ORDER — LORAZEPAM 2 MG/ML IJ SOLN: 1.0000 mg | INTRAMUSCULAR | Status: DC | PRN

## 2021-01-15 MED ORDER — PROCHLORPERAZINE EDISYLATE 10 MG/2ML IJ SOLN: 10.0000 mg | INTRAMUSCULAR | Status: DC | PRN

## 2021-01-15 MED ORDER — OXYCODONE HCL 5 MG PO TABS: 10.0000 mg | ORAL_TABLET | ORAL | Status: DC | PRN

## 2021-01-15 MED ORDER — METOCLOPRAMIDE HCL 5 MG PO TABS: 5.0000 mg | ORAL_TABLET | Freq: Three times a day (TID) | ORAL | Status: DC | PRN

## 2021-01-15 MED ORDER — PROPOFOL 10 MG/ML IV BOLUS
INTRAVENOUS | Status: DC | PRN
  Administered 2021-01-15: 200 mg via INTRAVENOUS

## 2021-01-15 MED ORDER — ACETAMINOPHEN 500 MG PO TABS
1000.0000 mg | ORAL_TABLET | Freq: Four times a day (QID) | ORAL | Status: AC
  Administered 2021-01-16 (×4): 1000 mg via ORAL
  Filled 2021-01-15 (×4): qty 2

## 2021-01-15 SURGICAL SUPPLY — 87 items
ADH SKN CLS APL DERMABOND .7 (GAUZE/BANDAGES/DRESSINGS) ×1
APL PRP STRL LF DISP 70% ISPRP (MISCELLANEOUS) ×2
BAG COUNTER SPONGE SURGICOUNT (BAG) ×2 IMPLANT
BAG SPNG CNTER NS LX DISP (BAG) ×1
BAG SURGICOUNT SPONGE COUNTING (BAG) ×1
BIT DRILL QC 2.5MM SHRT EVO SM (DRILL) IMPLANT
BIT DRILL QC EVOS SHORT 1.8 (DRILL) IMPLANT
BLADE CLIPPER SURG (BLADE) IMPLANT
BNDG CMPR MED 10X6 ELC LF (GAUZE/BANDAGES/DRESSINGS)
BNDG COHESIVE 6X5 TAN STRL LF (GAUZE/BANDAGES/DRESSINGS) ×1 IMPLANT
BNDG ELASTIC 4X5.8 VLCR STR LF (GAUZE/BANDAGES/DRESSINGS) ×2 IMPLANT
BNDG ELASTIC 6X10 VLCR STRL LF (GAUZE/BANDAGES/DRESSINGS) ×1 IMPLANT
BNDG ELASTIC 6X5.8 VLCR STR LF (GAUZE/BANDAGES/DRESSINGS) ×2 IMPLANT
BRUSH SCRUB EZ PLAIN DRY (MISCELLANEOUS) ×2 IMPLANT
CANISTER SUCT 3000ML PPV (MISCELLANEOUS) ×3 IMPLANT
CHLORAPREP W/TINT 26 (MISCELLANEOUS) ×5 IMPLANT
COVER SURGICAL LIGHT HANDLE (MISCELLANEOUS) ×3 IMPLANT
DERMABOND ADVANCED (GAUZE/BANDAGES/DRESSINGS) ×2
DERMABOND ADVANCED .7 DNX12 (GAUZE/BANDAGES/DRESSINGS) IMPLANT
DRAPE C-ARM 42X72 X-RAY (DRAPES) ×3 IMPLANT
DRAPE C-ARMOR (DRAPES) ×3 IMPLANT
DRAPE HALF SHEET 40X57 (DRAPES) ×6 IMPLANT
DRAPE ORTHO SPLIT 77X108 STRL (DRAPES) ×6
DRAPE SURG 17X23 STRL (DRAPES) ×3 IMPLANT
DRAPE SURG ORHT 6 SPLT 77X108 (DRAPES) ×2 IMPLANT
DRAPE U-SHAPE 47X51 STRL (DRAPES) ×3 IMPLANT
DRILL QC 2.5MM SHORT EVOS SM (DRILL) ×3
DRILL QC EVOS SHORT 1.8 (DRILL) ×3
DRSG ADAPTIC 3X8 NADH LF (GAUZE/BANDAGES/DRESSINGS) IMPLANT
DRSG MEPILEX BORDER 4X12 (GAUZE/BANDAGES/DRESSINGS) IMPLANT
DRSG MEPILEX BORDER 4X4 (GAUZE/BANDAGES/DRESSINGS) IMPLANT
DRSG MEPILEX BORDER 4X8 (GAUZE/BANDAGES/DRESSINGS) ×2 IMPLANT
DRSG PAD ABDOMINAL 8X10 ST (GAUZE/BANDAGES/DRESSINGS) ×3 IMPLANT
ELECT REM PT RETURN 9FT ADLT (ELECTROSURGICAL) ×3
ELECTRODE REM PT RTRN 9FT ADLT (ELECTROSURGICAL) ×1 IMPLANT
GAUZE SPONGE 4X4 12PLY STRL (GAUZE/BANDAGES/DRESSINGS) ×1 IMPLANT
GLOVE SURG ENC MOIS LTX SZ6.5 (GLOVE) ×9 IMPLANT
GLOVE SURG ENC MOIS LTX SZ7.5 (GLOVE) ×12 IMPLANT
GLOVE SURG UNDER POLY LF SZ6.5 (GLOVE) ×3 IMPLANT
GLOVE SURG UNDER POLY LF SZ7.5 (GLOVE) ×3 IMPLANT
GOWN STRL REUS W/ TWL LRG LVL3 (GOWN DISPOSABLE) ×3 IMPLANT
GOWN STRL REUS W/TWL LRG LVL3 (GOWN DISPOSABLE) ×9
K-WIRE 1.6 (WIRE) ×15
K-WIRE 16 (WIRE) ×6
K-WIRE FX150X1.6XTROC PNT (WIRE) ×5
KIT BASIN OR (CUSTOM PROCEDURE TRAY) ×3 IMPLANT
KIT TURNOVER KIT B (KITS) ×3 IMPLANT
KWIRE 16 (WIRE) IMPLANT
KWIRE FX150X1.6XTROC PNT (WIRE) IMPLANT
NS IRRIG 1000ML POUR BTL (IV SOLUTION) ×3 IMPLANT
PACK TOTAL JOINT (CUSTOM PROCEDURE TRAY) ×3 IMPLANT
PAD ARMBOARD 7.5X6 YLW CONV (MISCELLANEOUS) ×6 IMPLANT
PAD CAST 4YDX4 CTTN HI CHSV (CAST SUPPLIES) ×1 IMPLANT
PADDING CAST ABS 4INX4YD NS (CAST SUPPLIES) ×2
PADDING CAST ABS COTTON 4X4 ST (CAST SUPPLIES) IMPLANT
PADDING CAST COTTON 4X4 STRL (CAST SUPPLIES)
PADDING CAST COTTON 6X4 STRL (CAST SUPPLIES) ×1 IMPLANT
PLATE 3.5 MED DIST FEM 115 L (Plate) ×2 IMPLANT
SCREW 2.4X50 CTX EVOS (Screw) ×4 IMPLANT
SCREW CORT ST EVOS 3.5X46 (Screw) ×2 IMPLANT
SCREW CORT ST EVOS 3.5X55 (Screw) ×2 IMPLANT
SCREW CORT ST EVOS 3.5X60 (Screw) ×2 IMPLANT
SCREW CTX 3.5X36MM EVOS (Screw) ×2 IMPLANT
SCREW CTX 3.5X50MM EVOS (Screw) ×2 IMPLANT
SCREW CTX ST EVOS 3.5X40 (Screw) ×2 IMPLANT
SCREW LOCK 28X3.5XSTSTRL (Screw) IMPLANT
SCREW LOCK 3.5X36MM EVOS (Screw) ×2 IMPLANT
SCREW LOCK 30X3.5XSTSTRL (Screw) IMPLANT
SCREW LOCK 32X3.5XSTSTRL (Screw) IMPLANT
SCREW LOCKING 3.5X28 (Screw) ×3 IMPLANT
SCREW LOCKING 3.5X30 (Screw) ×3 IMPLANT
SCREW LOCKING 3.5X32 (Screw) ×3 IMPLANT
SPONGE T-LAP 18X18 ~~LOC~~+RFID (SPONGE) ×4 IMPLANT
STAPLER VISISTAT 35W (STAPLE) ×3 IMPLANT
SUCTION FRAZIER HANDLE 10FR (MISCELLANEOUS) ×3
SUCTION TUBE FRAZIER 10FR DISP (MISCELLANEOUS) ×1 IMPLANT
SUT ETHILON 3 0 PS 1 (SUTURE) ×2 IMPLANT
SUT MNCRL AB 3-0 PS2 18 (SUTURE) ×2 IMPLANT
SUT VIC AB 0 CT1 27 (SUTURE)
SUT VIC AB 0 CT1 27XBRD ANBCTR (SUTURE) IMPLANT
SUT VIC AB 1 CT1 27 (SUTURE) ×6
SUT VIC AB 1 CT1 27XBRD ANBCTR (SUTURE) IMPLANT
SUT VIC AB 2-0 CT1 27 (SUTURE) ×6
SUT VIC AB 2-0 CT1 TAPERPNT 27 (SUTURE) ×2 IMPLANT
TOWEL GREEN STERILE (TOWEL DISPOSABLE) ×6 IMPLANT
TRAY FOLEY MTR SLVR 16FR STAT (SET/KITS/TRAYS/PACK) IMPLANT
WATER STERILE IRR 1000ML POUR (IV SOLUTION) ×6 IMPLANT
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
## 2021-01-15 NOTE — Interval H&P Note (Signed)
History and Physical Interval Note:  01/15/2021 4:46 PM  John Kelley  has presented today for surgery, with the diagnosis of DISTAL FEMUR FRACTURE.  The various methods of treatment have been discussed with the patient and family. After consideration of risks, benefits and other options for treatment, the patient has consented to  Procedure(s): OPEN REDUCTION INTERNAL FIXATION (ORIF) DISTAL FEMUR FRACTURE (Left) as a surgical intervention.  The patient's history has been reviewed, patient examined, no change in status, stable for surgery.  I have reviewed the patient's chart and labs.  Questions were answered to the patient's satisfaction.     Caryn Bee P Denyce Harr

## 2021-01-15 NOTE — Anesthesia Preprocedure Evaluation (Signed)
Anesthesia Evaluation  Patient identified by MRN, date of birth, ID band Patient awake    Reviewed: Allergy & Precautions, NPO status , Patient's Chart, lab work & pertinent test results  Airway Mallampati: I  TM Distance: >3 FB Neck ROM: Full    Dental  (+) Dental Advisory Given   Pulmonary former smoker,    breath sounds clear to auscultation       Cardiovascular negative cardio ROS   Rhythm:Regular Rate:Normal     Neuro/Psych negative neurological ROS     GI/Hepatic negative GI ROS, Neg liver ROS,   Endo/Other  negative endocrine ROS  Renal/GU negative Renal ROS     Musculoskeletal   Abdominal   Peds  Hematology negative hematology ROS (+)   Anesthesia Other Findings   Reproductive/Obstetrics                             Anesthesia Physical Anesthesia Plan  ASA: 2  Anesthesia Plan: General   Post-op Pain Management:    Induction: Intravenous  PONV Risk Score and Plan: 2 and Dexamethasone, Ondansetron and Treatment may vary due to age or medical condition  Airway Management Planned: Oral ETT  Additional Equipment:   Intra-op Plan:   Post-operative Plan: Extubation in OR  Informed Consent: I have reviewed the patients History and Physical, chart, labs and discussed the procedure including the risks, benefits and alternatives for the proposed anesthesia with the patient or authorized representative who has indicated his/her understanding and acceptance.     Dental advisory given  Plan Discussed with: CRNA  Anesthesia Plan Comments:         Anesthesia Quick Evaluation
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
## 2021-01-15 NOTE — Anesthesia Procedure Notes (Signed)
Procedure Name: Intubation Date/Time: 01/15/2021 5:52 PM Performed by: Barrington Ellison, CRNA Pre-anesthesia Checklist: Patient identified, Emergency Drugs available, Suction available and Patient being monitored Patient Re-evaluated:Patient Re-evaluated prior to induction Oxygen Delivery Method: Circle System Utilized Preoxygenation: Pre-oxygenation with 100% oxygen Induction Type: IV induction Ventilation: Mask ventilation without difficulty Laryngoscope Size: Mac and 3 Grade View: Grade I Tube type: Oral Tube size: 7.5 mm Number of attempts: 1 Airway Equipment and Method: Stylet and Oral airway Placement Confirmation: ETT inserted through vocal cords under direct vision, positive ETCO2 and breath sounds checked- equal and bilateral Secured at: 21 cm Tube secured with: Tape Dental Injury: Teeth and Oropharynx as per pre-operative assessment

## 2021-01-15 NOTE — ED Notes (Signed)
Attempted report x 2 
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
## 2021-01-15 NOTE — Transfer of Care (Signed)
Immediate Anesthesia Transfer of Care Note  Patient: [place] L [place]  Procedure(s) Performed: OPEN REDUCTION INTERNAL FIXATION (ORIF) DISTAL FEMUR FRACTURE (Left)  Patient Location: PACU  Anesthesia Type:General  Level of Consciousness: drowsy  Airway & Oxygen Therapy: Patient Spontanous Breathing and Patient connected to nasal cannula oxygen  Post-op Assessment: Report given to RN, Post -op Vital signs reviewed and stable and Patient moving all extremities  Post vital signs: Reviewed and stable  Last Vitals:  Vitals Value Taken Time  BP    Temp 36.2 C 01/15/21 2030  Pulse 110 01/15/21 2031  Resp 25 01/15/21 2031  SpO2 100 % 01/15/21 2031  Vitals shown include unvalidated device data.  Last Pain:  Vitals:   01/15/21 1643  TempSrc:   PainSc: 0-No pain      Patients Stated Pain Goal: 0 (01/15/21 1004)  Complications: No notable events documented.

## 2021-01-15 NOTE — Anesthesia Postprocedure Evaluation (Signed)
Anesthesia Post Note  Patient: John Kelley  Procedure(s) Performed: OPEN REDUCTION INTERNAL FIXATION (ORIF) DISTAL FEMUR FRACTURE (Left)     Patient location during evaluation: PACU Anesthesia Type: General Level of consciousness: awake Pain management: pain level controlled Vital Signs Assessment: post-procedure vital signs reviewed and stable Respiratory status: spontaneous breathing, nonlabored ventilation, respiratory function stable and patient connected to nasal cannula oxygen Cardiovascular status: blood pressure returned to baseline and stable Postop Assessment: no apparent nausea or vomiting Anesthetic complications: no   No notable events documented.  Last Vitals:  Vitals:   01/15/21 2100 01/15/21 2124  BP: (!) 141/84 138/85  Pulse: 98 85  Resp: 17 17  Temp: (!) 36.1 C 36.7 C  SpO2: 93% 97%    Last Pain:  Vitals:   01/15/21 2124  TempSrc: Oral  PainSc:                  Catheryn Bacon Ginnette Gates
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
## 2021-01-15 NOTE — ED Notes (Signed)
Pt taken to CT with TRN  
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
## 2021-01-15 NOTE — Progress Notes (Signed)
PT Cancellation Note  Patient Details Name: John Kelley MRN: [id number] DOB: Jul 26, 1990   Cancelled Treatment:    Reason Eval/Treat Not Completed: Patient at procedure or test/unavailable at this time as pt has left unit for planned surgical intervention for LLE. PT will continue to follow and evaluate as appropriate.   [name], PT, DPT   Acute Rehabilitation Department Pager #: [phone]   [name] 01/15/2021, 4:24 PM
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
## 2021-01-15 NOTE — ED Notes (Signed)
Attempted report x1. 
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
## 2021-01-15 NOTE — Progress Notes (Signed)
Orthopedic Tech Progress Note Patient Details:  John Kelley [birth year] [id number]  Ortho Devices Type of Ortho Device: Knee Immobilizer Ortho Device/Splint Location: Left Knee Ortho Device/Splint Interventions: Application   Post Interventions Patient Tolerated: Well  Katryna Tschirhart E Abednego Yeates 01/15/2021, 9:15 AM

## 2021-01-15 NOTE — ED Notes (Signed)
PT 02 desat to 80's after pain med administration. PT placed on 2 L Rulo.

## 2021-01-16 LAB — CBC
HCT: 27.5 % — ABNORMAL LOW (ref 39.0–52.0)
Hemoglobin: 9.4 g/dL — ABNORMAL LOW (ref 13.0–17.0)
MCH: 34.9 pg — ABNORMAL HIGH (ref 26.0–34.0)
MCHC: 34.2 g/dL (ref 30.0–36.0)
MCV: 102.2 fL — ABNORMAL HIGH (ref 80.0–100.0)
Platelets: 153 10*3/uL (ref 150–400)
RBC: 2.69 MIL/uL — ABNORMAL LOW (ref 4.22–5.81)
RDW: 11.6 % (ref 11.5–15.5)
WBC: 11.6 10*3/uL — ABNORMAL HIGH (ref 4.0–10.5)
nRBC: 0 % (ref 0.0–0.2)

## 2021-01-16 LAB — BASIC METABOLIC PANEL
Anion gap: 8 (ref 5–15)
BUN: 6 mg/dL (ref 6–20)
CO2: 27 mmol/L (ref 22–32)
Calcium: 8.7 mg/dL — ABNORMAL LOW (ref 8.9–10.3)
Chloride: 103 mmol/L (ref 98–111)
Creatinine, Ser: 0.75 mg/dL (ref 0.61–1.24)
GFR, Estimated: >60 mL/min (ref >60)
Glucose, Bld: 119 mg/dL — ABNORMAL HIGH (ref 70–99)
Potassium: 4.1 mmol/L (ref 3.5–5.1)
Sodium: 138 mmol/L (ref 135–145)

## 2021-01-16 LAB — VITAMIN D 25 HYDROXY (VIT D DEFICIENCY, FRACTURES): Vit D, 25-Hydroxy: 11.96 ng/mL — ABNORMAL LOW (ref 30–100)

## 2021-01-16 MED ORDER — ASPIRIN EC 325 MG PO TBEC: 325.0000 mg | DELAYED_RELEASE_TABLET | Freq: Two times a day (BID) | ORAL | 0 refills | Status: AC

## 2021-01-16 MED ORDER — OXYCODONE HCL 10 MG PO TABS: 5.0000 mg | ORAL_TABLET | Freq: Four times a day (QID) | ORAL | 0 refills | Status: DC | PRN

## 2021-01-16 MED ORDER — ACETAMINOPHEN 500 MG PO TABS: 1000.0000 mg | ORAL_TABLET | Freq: Four times a day (QID) | ORAL | 0 refills | Status: DC

## 2021-01-16 MED ORDER — METHOCARBAMOL 500 MG PO TABS: 500.0000 mg | ORAL_TABLET | Freq: Four times a day (QID) | ORAL | 0 refills | Status: DC | PRN
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
## 2021-01-16 NOTE — Discharge Instructions (Signed)
Orthopaedic Trauma Service Discharge Instructions   General Discharge Instructions  WEIGHT BEARING STATUS:Non-weightbearing left lower extremity  RANGE OF MOTION/ACTIVITY: Keep hinge brace on and locked in extension. Do not attempt knee motion until you are seen for follow-up appointment  Wound Care:You remove remove your ace wrap and surgical dressings. Incisions can be left open to air if there is no drainage. If incision continues to have drainage, follow wound care instructions below. Okay to shower if no drainage from incisions. Wash incisions with soap and water  DVT/PE prophylaxis: Aspirin 325 mg twice daily x 30 days  Diet: as you were eating previously.  Can use over the counter stool softeners and bowel preparations, such as Miralax, to help with bowel movements.  Narcotics can be constipating.  Be sure to drink plenty of fluids  PAIN MEDICATION USE AND EXPECTATIONS  You have likely been given narcotic medications to help control your pain.  After a traumatic event that results in an fracture (broken bone) with or without surgery, it is ok to use narcotic pain medications to help control one's pain.  We understand that everyone responds to pain differently and each individual patient will be evaluated on a regular basis for the continued need for narcotic medications. Ideally, narcotic medication use should last no more than 6-8 weeks (coinciding with fracture healing).   As a patient it is your responsibility as well to monitor narcotic medication use and report the amount and frequency you use these medications when you come to your office visit.   We would also advise that if you are using narcotic medications, you should take a dose prior to therapy to maximize you participation.  IF YOU ARE ON NARCOTIC MEDICATIONS IT IS NOT PERMISSIBLE TO OPERATE A MOTOR VEHICLE (MOTORCYCLE/CAR/TRUCK/MOPED) OR HEAVY MACHINERY DO NOT MIX NARCOTICS WITH OTHER CNS (CENTRAL NERVOUS SYSTEM)  DEPRESSANTS SUCH AS ALCOHOL   STOP SMOKING OR USING NICOTINE PRODUCTS!!!!  As discussed nicotine severely impairs your body's ability to heal surgical and traumatic wounds but also impairs bone healing.  Wounds and bone heal by forming microscopic blood vessels (angiogenesis) and nicotine is a vasoconstrictor (essentially, shrinks blood vessels).  Therefore, if vasoconstriction occurs to these microscopic blood vessels they essentially disappear and are unable to deliver necessary nutrients to the healing tissue.  This is one modifiable factor that you can do to dramatically increase your chances of healing your injury.    (This means no smoking, no nicotine gum, patches, etc)  DO NOT USE NONSTEROIDAL ANTI-INFLAMMATORY DRUGS (NSAID'S)  Using products such as Advil (ibuprofen), Aleve (naproxen), Motrin (ibuprofen) for additional pain control during fracture healing can delay and/or prevent the healing response.  If you would like to take over the counter (OTC) medication, Tylenol (acetaminophen) is ok.  However, some narcotic medications that are given for pain control contain acetaminophen as well. Therefore, you should not exceed more than 4000 mg of tylenol in a day if you do not have liver disease.  Also note that there are may OTC medicines, such as cold medicines and allergy medicines that my contain tylenol as well.  If you have any questions about medications and/or interactions please ask your doctor/PA or your pharmacist.      ICE AND ELEVATE INJURED/OPERATIVE EXTREMITY  Using ice and elevating the injured extremity above your heart can help with swelling and pain control.  Icing in a pulsatile fashion, such as 20 minutes on and 20 minutes off, can be followed.    Do  not place ice directly on skin. Make sure there is a barrier between to skin and the ice pack.    Using frozen items such as frozen peas works well as the conform nicely to the are that needs to be iced.  USE AN ACE WRAP OR TED  HOSE FOR SWELLING CONTROL  In addition to icing and elevation, Ace wraps or TED hose are used to help limit and resolve swelling.  It is recommended to use Ace wraps or TED hose until you are informed to stop.    When using Ace Wraps start the wrapping distally (farthest away from the body) and wrap proximally (closer to the body)   Example: If you had surgery on your leg or thing and you do not have a splint on, start the ace wrap at the toes and work your way up to the thigh        If you had surgery on your upper extremity and do not have a splint on, start the ace wrap at your fingers and work your way up to the upper arm   CALL THE OFFICE WITH ANY QUESTIONS/CONCERNS OR MEDICATION REFILLS: [phone]   VISIT OUR WEBSITE FOR ADDITIONAL INFORMATION: [email or web address]     Discharge Wound Care Instructions  Do NOT apply any ointments, solutions or lotions to pin sites or surgical wounds.  These prevent needed drainage and even though solutions like hydrogen peroxide kill bacteria, they also damage cells lining the pin sites that help fight infection.  Applying lotions or ointments can keep the wounds moist and can cause them to breakdown and open up as well. This can increase the risk for infection. When in doubt call the office.  If any drainage is noted, use one layer of adaptic, then gauze, Kerlix, and an ace wrap.  Once the incision is completely dry and without drainage, it may be left open to air out.  Showering may begin 36-48 hours later.  Cleaning gently with soap and water.
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
## 2021-01-16 NOTE — Progress Notes (Signed)
Orthopedic Tech Progress Note Patient Details:  John Kelley 1991-05-25 [id number]  Ortho Devices Type of Ortho Device: Crutches Ortho Device/Splint Location: Left Knee Ortho Device/Splint Interventions: Ordered   Post Interventions Patient Tolerated: Well  Saylee Sherrill E Gelsey Amyx 01/16/2021, 1:50 PM
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
## 2021-01-20 ENCOUNTER — Encounter (HOSPITAL_COMMUNITY): Payer: Self-pay | Admitting: Student
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
## 2021-03-24 ENCOUNTER — Ambulatory Visit: Payer: Self-pay | Admitting: Student

## 2021-03-24 DIAGNOSIS — S72402A Unspecified fracture of lower end of left femur, initial encounter for closed fracture: Secondary | ICD-10-CM

## 2021-03-26 ENCOUNTER — Other Ambulatory Visit: Payer: Self-pay

## 2021-03-26 ENCOUNTER — Encounter (HOSPITAL_COMMUNITY): Payer: Self-pay | Admitting: Student

## 2021-03-26 NOTE — Progress Notes (Signed)
Spoke with pt pre-op instructions given. 

## 2021-03-28 NOTE — H&P (View-Only) (Signed)
Orthopaedic Trauma Service (OTS) H&P  Patient ID: John Kelley MRN: [id number] DOB/AGE: 1990/10/28 29 y.o.  Reason for surgery: Closed manipulation left knee  HPI: John Kelley is an 30 y.o. male presenting for surgery on the left knee.  Patient underwent ORIF of left distal femur fracture on 01/15/2021.  Over the last 2 and half months patient's fracture has healed well but he has not been moving the knee much.  As a result, he has developed a knee contracture and has been unable to progress with increasing his knee motion  beyond 40 degrees of flexion without significant pain.  He now presents for closed manipulation of the knee joint under anesthesia.  Past Medical History:  Diagnosis Date   Open wound of left foot 02/2017    Past Surgical History:  Procedure Laterality Date   I & D EXTREMITY Left 01/08/2017   Procedure: IRRIGATION AND DEBRIDEMENT , closed reduction pinning of open foot fracture, application of wound vac.;  Surgeon: Venita Lick, MD;  Location: [facility];  Service: Orthopedics;  Laterality: Left;   INCISION AND DRAINAGE OF WOUND Left 01/25/2017   Procedure: DEBRIDEMENT OF LEFT FOOT WOUND WITH INTEGRA  APPLICATION;  Surgeon: Glenna Fellows, MD;  Location: [facility];  Service: Plastics;  Laterality: Left;   IRRIGATION AND DEBRIDEMENT OF WOUND WITH SPLIT THICKNESS SKIN GRAFT Left 02/18/2017   Procedure: SPLIT THICKNESS SKIN GRAFT FROM LEFT THIGH TO LEFT FOOT,;  Surgeon: Glenna Fellows, MD;  Location: [facility];  Service: Plastics;  Laterality: Left;   ORIF FEMUR FRACTURE Left 01/15/2021   Procedure: OPEN REDUCTION INTERNAL FIXATION (ORIF) DISTAL FEMUR FRACTURE;  Surgeon: Roby Lofts, MD;  Location: [facility] OR;  Service: Orthopedics;  Laterality: Left;   TIBIA IM NAIL INSERTION Right 05/09/2019   Procedure: Intramedullary (Im) Nail Tibial;  Surgeon: Bjorn Pippin, MD;  Location: [facility] OR;  Service: Orthopedics;  Laterality: Right;     History reviewed. No pertinent family history.  Social History:  reports that he quit smoking about 4 years ago. He has never used smokeless tobacco. He reports current alcohol use. He reports that he does not use drugs.  Allergies: No Known Allergies  Medications: I have reviewed the patient's current medications. Prior to Admission:  No medications prior to admission.    ROS: Constitutional: No fever or chills Vision: No changes in vision ENT: No difficulty swallowing CV: No chest pain Pulm: No SOB or wheezing GI: No nausea or vomiting GU: No urgency or inability to hold urine Skin: No poor wound healing Neurologic: No numbness or tingling Psychiatric: No depression or anxiety Heme: No bruising Allergic: No reaction to medications or food   Exam: There were no vitals taken for this visit. General: No acute distress Orientation: Alert and oriented x4 Mood and Affect: Mood and affect appropriate, pleasant and cooperative Gait: Nonweightbearing left lower extremity.  Ambulates with crutches. Coordination and balance: within normal limits  LLE: Well-healed surgical incisions.  Mildly tender about the knee.  Significantly limited knee range of motion.  About 15 degrees shy of full knee extension only able to flex about 40 degrees actively and passively.  Ankle dorsiflexion/plantarflexion is intact.  Sensation is intact lower extremity.  Compartments are soft and compressible.  He is neurovascularly intact.  RLE: Skin without lesions. No tenderness to palpation. Full painless ROM, full strength in each muscle group without evidence of instability.  Motor and sensory function grossly intact.  Neurovascularly intact   Medical  Decision Making: Data: Imaging: AP and lateral views left knee shows medial plate and screws in place over the distal femur.  Fracture is stable.  No signs of hardware failure or loosening.  Labs: No results found for this or any previous visit (from  the past 168 hour(s)).   Assessment/Plan: 30 year old male status post ORIF of left distal femur on 01/15/2021  Patient is now 2.5 months out status post the above.  His knee range of motion continues to be significantly limited I do not feel that this will improve on its own without manipulation of the joint.  Risks and benefits of the procedure discussed with the patient and he agrees to proceed.  We will plan to discharge patient home postoperatively and encourage aggressive knee range of motion.   Laquia Rosano A. Ladonna Snide [facility] [phone] (office) [email or web address]

## 2021-03-28 NOTE — Progress Notes (Signed)
Orthopaedic Trauma Service (OTS) H&P  Patient ID: John Kelley MRN: [id number] DOB/AGE: 1990/10/28 30 y.o.  Reason for surgery: Closed manipulation left knee  HPI: John Kelley is an 30 y.o. male presenting for surgery on the left knee.  Patient underwent ORIF of left distal femur fracture on 01/15/2021.  Over the last 2 and half months patient's fracture has healed well but he has not been moving the knee much.  As a result, he has developed a knee contracture and has been unable to progress with increasing his knee motion  beyond 40 degrees of flexion without significant pain.  He now presents for closed manipulation of the knee joint under anesthesia.  Past Medical History:  Diagnosis Date   Open wound of left foot 02/2017    Past Surgical History:  Procedure Laterality Date   I & D EXTREMITY Left 01/08/2017   Procedure: IRRIGATION AND DEBRIDEMENT , closed reduction pinning of open foot fracture, application of wound vac.;  Surgeon: Venita Lick, MD;  Location: [facility];  Service: Orthopedics;  Laterality: Left;   INCISION AND DRAINAGE OF WOUND Left 01/25/2017   Procedure: DEBRIDEMENT OF LEFT FOOT WOUND WITH INTEGRA  APPLICATION;  Surgeon: Glenna Fellows, MD;  Location: [facility];  Service: Plastics;  Laterality: Left;   IRRIGATION AND DEBRIDEMENT OF WOUND WITH SPLIT THICKNESS SKIN GRAFT Left 02/18/2017   Procedure: SPLIT THICKNESS SKIN GRAFT FROM LEFT THIGH TO LEFT FOOT,;  Surgeon: Glenna Fellows, MD;  Location: [facility];  Service: Plastics;  Laterality: Left;   ORIF FEMUR FRACTURE Left 01/15/2021   Procedure: OPEN REDUCTION INTERNAL FIXATION (ORIF) DISTAL FEMUR FRACTURE;  Surgeon: Roby Lofts, MD;  Location: [facility];  Service: Orthopedics;  Laterality: Left;   TIBIA IM NAIL INSERTION Right 05/09/2019   Procedure: Intramedullary (Im) Nail Tibial;  Surgeon: Bjorn Pippin, MD;  Location: [facility];  Service: Orthopedics;  Laterality: Right;     History reviewed. No pertinent family history.  Social History:  reports that he quit smoking about 4 years ago. He has never used smokeless tobacco. He reports current alcohol use. He reports that he does not use drugs.  Allergies: No Known Allergies  Medications: I have reviewed the patient's current medications. Prior to Admission:  No medications prior to admission.    ROS: Constitutional: No fever or chills Vision: No changes in vision ENT: No difficulty swallowing CV: No chest pain Pulm: No SOB or wheezing GI: No nausea or vomiting GU: No urgency or inability to hold urine Skin: No poor wound healing Neurologic: No numbness or tingling Psychiatric: No depression or anxiety Heme: No bruising Allergic: No reaction to medications or food   Exam: There were no vitals taken for this visit. General: No acute distress Orientation: Alert and oriented x4 Mood and Affect: Mood and affect appropriate, pleasant and cooperative Gait: Nonweightbearing left lower extremity.  Ambulates with crutches. Coordination and balance: within normal limits  LLE: Well-healed surgical incisions.  Mildly tender about the knee.  Significantly limited knee range of motion.  About 15 degrees shy of full knee extension only able to flex about 40 degrees actively and passively.  Ankle dorsiflexion/plantarflexion is intact.  Sensation is intact lower extremity.  Compartments are soft and compressible.  He is neurovascularly intact.  RLE: Skin without lesions. No tenderness to palpation. Full painless ROM, full strength in each muscle group without evidence of instability.  Motor and sensory function grossly intact.  Neurovascularly intact   Medical  Decision Making: Data: Imaging: AP and lateral views left knee shows medial plate and screws in place over the distal femur.  Fracture is stable.  No signs of hardware failure or loosening.  Labs: No results found for this or any previous visit (from  the past 168 hour(s)).   Assessment/Plan: 30-year-old male status post ORIF of left distal femur on 01/15/2021  Patient is now 2.5 months out status post the above.  His knee range of motion continues to be significantly limited I do not feel that this will improve on its own without manipulation of the joint.  Risks and benefits of the procedure discussed with the patient and he agrees to proceed.  We will plan to discharge patient home postoperatively and encourage aggressive knee range of motion.   Ry Moody A. Eriel Dunckel, PA-C [facility] [phone] (office) [email or web address]    

## 2021-03-30 ENCOUNTER — Ambulatory Visit (HOSPITAL_COMMUNITY): Payer: Managed Care, Other (non HMO) | Admitting: Anesthesiology

## 2021-03-30 ENCOUNTER — Encounter (HOSPITAL_COMMUNITY)
Admission: RE | Disposition: A | Discharge status: Home / Self Care | Payer: Self-pay | Source: Home / Self Care | Attending: Student

## 2021-03-30 ENCOUNTER — Ambulatory Visit (HOSPITAL_COMMUNITY)
Admission: RE | Admit: 2021-03-30 | Discharge: 2021-03-30 | Disposition: A | Discharge status: Home / Self Care | Payer: Managed Care, Other (non HMO) | Attending: Student | Admitting: Student

## 2021-03-30 ENCOUNTER — Ambulatory Visit (HOSPITAL_COMMUNITY): Payer: Managed Care, Other (non HMO)

## 2021-03-30 ENCOUNTER — Encounter (HOSPITAL_COMMUNITY): Payer: Self-pay | Admitting: Student

## 2021-03-30 DIAGNOSIS — Z87891 Personal history of nicotine dependence: Secondary | ICD-10-CM | POA: Diagnosis not present

## 2021-03-30 DIAGNOSIS — M24662 Ankylosis, left knee: Secondary | ICD-10-CM | POA: Insufficient documentation

## 2021-03-30 DIAGNOSIS — S72412A Displaced unspecified condyle fracture of lower end of left femur, initial encounter for closed fracture: Secondary | ICD-10-CM

## 2021-03-30 DIAGNOSIS — S7290XA Unspecified fracture of unspecified femur, initial encounter for closed fracture: Secondary | ICD-10-CM

## 2021-03-30 DIAGNOSIS — Z419 Encounter for procedure for purposes other than remedying health state, unspecified: Secondary | ICD-10-CM

## 2021-03-30 HISTORY — PX: KNEE CLOSED REDUCTION: SHX995

## 2021-03-30 SURGERY — MANIPULATION, KNEE, CLOSED
Anesthesia: General | Site: Knee | Laterality: Left

## 2021-03-30 MED ORDER — PROPOFOL 10 MG/ML IV BOLUS
INTRAVENOUS | Status: DC | PRN
  Administered 2021-03-30: 200 mg via INTRAVENOUS

## 2021-03-30 MED ORDER — DEXAMETHASONE SODIUM PHOSPHATE 10 MG/ML IJ SOLN
INTRAMUSCULAR | Status: DC | PRN
  Administered 2021-03-30: 8 mg via INTRAVENOUS

## 2021-03-30 MED ORDER — MIDAZOLAM HCL 2 MG/2ML IJ SOLN
INTRAMUSCULAR | Status: DC | PRN
  Administered 2021-03-30: 2 mg via INTRAVENOUS

## 2021-03-30 MED ORDER — MIDAZOLAM HCL 2 MG/2ML IJ SOLN
INTRAMUSCULAR | Status: AC
  Filled 2021-03-30: qty 2

## 2021-03-30 MED ORDER — ORAL CARE MOUTH RINSE: 15.0000 mL | Freq: Once | OROMUCOSAL | Status: AC

## 2021-03-30 MED ORDER — OXYCODONE HCL 5 MG PO TABS: 5.0000 mg | ORAL_TABLET | Freq: Four times a day (QID) | ORAL | 0 refills | Status: AC | PRN

## 2021-03-30 MED ORDER — LACTATED RINGERS IV SOLN: INTRAVENOUS | Status: DC

## 2021-03-30 MED ORDER — OXYCODONE HCL 5 MG PO TABS
ORAL_TABLET | ORAL | Status: AC
  Filled 2021-03-30: qty 1

## 2021-03-30 MED ORDER — ONDANSETRON HCL 4 MG/2ML IJ SOLN
INTRAMUSCULAR | Status: DC | PRN
  Administered 2021-03-30: 4 mg via INTRAVENOUS

## 2021-03-30 MED ORDER — OXYCODONE HCL 5 MG/5ML PO SOLN: 5.0000 mg | Freq: Once | ORAL | Status: AC | PRN

## 2021-03-30 MED ORDER — FENTANYL CITRATE (PF) 100 MCG/2ML IJ SOLN
25.0000 ug | INTRAMUSCULAR | Status: DC | PRN
  Administered 2021-03-30: 25 ug via INTRAVENOUS
  Administered 2021-03-30: 50 ug via INTRAVENOUS
  Administered 2021-03-30: 25 ug via INTRAVENOUS

## 2021-03-30 MED ORDER — FENTANYL CITRATE (PF) 100 MCG/2ML IJ SOLN
INTRAMUSCULAR | Status: AC
  Filled 2021-03-30: qty 2

## 2021-03-30 MED ORDER — PROMETHAZINE HCL 25 MG/ML IJ SOLN: 6.2500 mg | INTRAMUSCULAR | Status: DC | PRN

## 2021-03-30 MED ORDER — FENTANYL CITRATE (PF) 250 MCG/5ML IJ SOLN
INTRAMUSCULAR | Status: DC | PRN
  Administered 2021-03-30: 50 ug via INTRAVENOUS

## 2021-03-30 MED ORDER — OXYCODONE HCL 5 MG PO TABS
5.0000 mg | ORAL_TABLET | Freq: Once | ORAL | Status: AC | PRN
  Administered 2021-03-30: 5 mg via ORAL

## 2021-03-30 MED ORDER — CHLORHEXIDINE GLUCONATE 0.12 % MT SOLN
OROMUCOSAL | Status: AC
  Administered 2021-03-30: 15 mL via OROMUCOSAL
  Filled 2021-03-30: qty 15

## 2021-03-30 MED ORDER — ACETAMINOPHEN 500 MG PO TABS
1000.0000 mg | ORAL_TABLET | Freq: Once | ORAL | Status: AC
  Administered 2021-03-30: 1000 mg via ORAL
  Filled 2021-03-30: qty 2

## 2021-03-30 MED ORDER — FENTANYL CITRATE (PF) 250 MCG/5ML IJ SOLN
INTRAMUSCULAR | Status: AC
  Filled 2021-03-30: qty 5

## 2021-03-30 MED ORDER — CHLORHEXIDINE GLUCONATE 0.12 % MT SOLN: 15.0000 mL | Freq: Once | OROMUCOSAL | Status: AC

## 2021-03-30 MED ORDER — LIDOCAINE 2% (20 MG/ML) 5 ML SYRINGE
INTRAMUSCULAR | Status: DC | PRN
  Administered 2021-03-30: 60 mg via INTRAVENOUS

## 2021-03-30 MED ORDER — DROPERIDOL 2.5 MG/ML IJ SOLN: 0.6250 mg | Freq: Once | INTRAMUSCULAR | Status: DC | PRN

## 2021-03-30 MED ORDER — ACETAMINOPHEN 500 MG PO TABS: 1000.0000 mg | ORAL_TABLET | Freq: Four times a day (QID) | ORAL | 0 refills | Status: AC

## 2021-03-30 NOTE — Discharge Instructions (Addendum)
Orthopaedic Trauma Service Discharge Instructions   General Discharge Instructions  WEIGHT BEARING STATUS: Weightbearing as tolerated left lower extremity  RANGE OF MOTION/ACTIVITY: Continue with aggressive knee range of motion  DVT/PE prophylaxis: None  Diet: as you were eating previously.  Can use over the counter stool softeners and bowel preparations, such as Miralax, to help with bowel movements.  Narcotics can be constipating.  Be sure to drink plenty of fluids  PAIN MEDICATION USE AND EXPECTATIONS  You have likely been given narcotic medications to help control your pain.  After a traumatic event that results in an fracture (broken bone) with or without surgery, it is ok to use narcotic pain medications to help control one's pain.  We understand that everyone responds to pain differently and each individual patient will be evaluated on a regular basis for the continued need for narcotic medications. Ideally, narcotic medication use should last no more than 6-8 weeks (coinciding with fracture healing).   As a patient it is your responsibility as well to monitor narcotic medication use and report the amount and frequency you use these medications when you come to your office visit.   We would also advise that if you are using narcotic medications, you should take a dose prior to therapy to maximize you participation.  IF YOU ARE ON NARCOTIC MEDICATIONS IT IS NOT PERMISSIBLE TO OPERATE A MOTOR VEHICLE (MOTORCYCLE/CAR/TRUCK/MOPED) OR HEAVY MACHINERY DO NOT MIX NARCOTICS WITH OTHER CNS (CENTRAL NERVOUS SYSTEM) DEPRESSANTS SUCH AS ALCOHOL   STOP SMOKING OR USING NICOTINE PRODUCTS!!!!  As discussed nicotine severely impairs your body's ability to heal surgical and traumatic wounds but also impairs bone healing.  Wounds and bone heal by forming microscopic blood vessels (angiogenesis) and nicotine is a vasoconstrictor (essentially, shrinks blood vessels).  Therefore, if vasoconstriction  occurs to these microscopic blood vessels they essentially disappear and are unable to deliver necessary nutrients to the healing tissue.  This is one modifiable factor that you can do to dramatically increase your chances of healing your injury.    (This means no smoking, no nicotine gum, patches, etc)   ICE AND ELEVATE INJURED/OPERATIVE EXTREMITY  Using ice and elevating the injured extremity above your heart can help with swelling and pain control.  Icing in a pulsatile fashion, such as 20 minutes on and 20 minutes off, can be followed.    Do not place ice directly on skin. Make sure there is a barrier between to skin and the ice pack.    Using frozen items such as frozen peas works well as the conform nicely to the are that needs to be iced.  USE AN ACE WRAP OR TED HOSE FOR SWELLING CONTROL  In addition to icing and elevation, Ace wraps or TED hose are used to help limit and resolve swelling.  It is recommended to use Ace wraps or TED hose until you are informed to stop.    When using Ace Wraps start the wrapping distally (farthest away from the body) and wrap proximally (closer to the body)   Example: If you had surgery on your leg or thing and you do not have a splint on, start the ace wrap at the toes and work your way up to the thigh        If you had surgery on your upper extremity and do not have a splint on, start the ace wrap at your fingers and work your way up to the upper arm   CALL THE OFFICE  WITH ANY QUESTIONS OR CONCERNS: [phone]   VISIT OUR WEBSITE FOR ADDITIONAL INFORMATION: [email or web address]

## 2021-03-30 NOTE — Anesthesia Postprocedure Evaluation (Signed)
Anesthesia Post Note  Patient: [place] L Rubendall  Procedure(s) Performed: CLOSED MANIPULATION KNEE (Left: Knee)     Patient location during evaluation: PACU Anesthesia Type: General Level of consciousness: awake Pain management: pain level controlled Vital Signs Assessment: post-procedure vital signs reviewed and stable Respiratory status: spontaneous breathing, nonlabored ventilation, respiratory function stable and patient connected to nasal cannula oxygen Cardiovascular status: blood pressure returned to baseline and stable Postop Assessment: no apparent nausea or vomiting Anesthetic complications: no   No notable events documented.  Last Vitals:  Vitals:   03/30/21 1326 03/30/21 1355  BP: (!) 127/92 (!) 133/91  Pulse: 79 66  Resp: 14 11  Temp:  (!) 36.3 C  SpO2: 100% 99%    Last Pain:  Vitals:   03/30/21 1355  TempSrc:   PainSc: Asleep                 Larena Ohnemus P Fermon Ureta
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
## 2021-03-30 NOTE — Anesthesia Procedure Notes (Signed)
Procedure Name: LMA Insertion Date/Time: 03/30/2021 12:31 PM Performed by: Marena Chancy, CRNA Pre-anesthesia Checklist: Patient identified, Emergency Drugs available, Suction available and Patient being monitored Patient Re-evaluated:Patient Re-evaluated prior to induction Oxygen Delivery Method: Circle System Utilized Preoxygenation: Pre-oxygenation with 100% oxygen Induction Type: IV induction Ventilation: Mask ventilation without difficulty LMA: LMA inserted LMA Size: 4.0 Number of attempts: 1 Airway Equipment and Method: Bite block Placement Confirmation: positive ETCO2 Tube secured with: Tape Dental Injury: Teeth and Oropharynx as per pre-operative assessment

## 2021-03-30 NOTE — Interval H&P Note (Signed)
History and Physical Interval Note:  03/30/2021 11:27 AM  John Kelley  has presented today for surgery, with the diagnosis of Left knee stiffness.  The various methods of treatment have been discussed with the patient and family. After consideration of risks, benefits and other options for treatment, the patient has consented to  Procedure(s): CLOSED MANIPULATION KNEE (Left) as a surgical intervention.  The patient's history has been reviewed, patient examined, no change in status, stable for surgery.  I have reviewed the patient's chart and labs.  Questions were answered to the patient's satisfaction.     Caryn Bee P Kamaryn Grimley

## 2021-03-30 NOTE — Anesthesia Preprocedure Evaluation (Addendum)
Anesthesia Evaluation  Patient identified by MRN, date of birth, ID band Patient awake    Reviewed: Allergy & Precautions, NPO status , Patient's Chart, lab work & pertinent test results  Airway Mallampati: II  TM Distance: >3 FB Neck ROM: Full    Dental no notable dental hx.    Pulmonary neg pulmonary ROS, former smoker,    Pulmonary exam normal breath sounds clear to auscultation       Cardiovascular negative cardio ROS Normal cardiovascular exam Rhythm:Regular Rate:Normal     Neuro/Psych negative neurological ROS  negative psych ROS   GI/Hepatic negative GI ROS, Neg liver ROS,   Endo/Other  negative endocrine ROS  Renal/GU negative Renal ROS  negative genitourinary   Musculoskeletal negative musculoskeletal ROS (+)   Abdominal   Peds negative pediatric ROS (+)  Hematology negative hematology ROS (+)   Anesthesia Other Findings   Reproductive/Obstetrics negative OB ROS                             Anesthesia Physical Anesthesia Plan  ASA: 2  Anesthesia Plan: General   Post-op Pain Management:    Induction:   PONV Risk Score and Plan: 2 and Treatment may vary due to age or medical condition, Midazolam, Ondansetron and Dexamethasone  Airway Management Planned: LMA  Additional Equipment: None  Intra-op Plan:   Post-operative Plan: Extubation in OR  Informed Consent: I have reviewed the patients History and Physical, chart, labs and discussed the procedure including the risks, benefits and alternatives for the proposed anesthesia with the patient or authorized representative who has indicated his/her understanding and acceptance.       Plan Discussed with: CRNA, Anesthesiologist and Surgeon  Anesthesia Plan Comments: (GA/LMA. )       Anesthesia Quick Evaluation

## 2021-03-30 NOTE — Transfer of Care (Signed)
Immediate Anesthesia Transfer of Care Note  Patient: John Kelley  Procedure(s) Performed: CLOSED MANIPULATION KNEE (Left: Knee)  Patient Location: PACU  Anesthesia Type:General  Level of Consciousness: awake, alert  and oriented  Airway & Oxygen Therapy: Patient Spontanous Breathing and Patient connected to nasal cannula oxygen  Post-op Assessment: Report given to RN, Post -op Vital signs reviewed and stable and Patient moving all extremities X 4  Post vital signs: Reviewed and stable  Last Vitals:  Vitals Value Taken Time  BP 129/95 03/30/21 1256  Temp    Pulse 81 03/30/21 1257  Resp 12 03/30/21 1256  SpO2 100 % 03/30/21 1257  Vitals shown include unvalidated device data.  Last Pain:  Vitals:   03/30/21 0649  TempSrc:   PainSc: 0-No pain      Patients Stated Pain Goal: 2 (03/30/21 9767)  Complications: No notable events documented.

## 2021-03-31 ENCOUNTER — Encounter (HOSPITAL_COMMUNITY): Payer: Self-pay | Admitting: Student

## 2024-08-05 ENCOUNTER — Encounter (HOSPITAL_COMMUNITY): Payer: Self-pay

## 2024-08-05 ENCOUNTER — Ambulatory Visit (HOSPITAL_COMMUNITY)
Admission: EM | Admit: 2024-08-05 | Discharge: 2024-08-05 | Disposition: A | Discharge status: Home / Self Care | Payer: Self-pay

## 2024-08-05 DIAGNOSIS — U071 COVID-19: Secondary | ICD-10-CM

## 2024-08-05 LAB — POCT INFLUENZA A/B
Influenza A, POC: NEGATIVE
Influenza B, POC: NEGATIVE

## 2024-08-05 LAB — POC SOFIA SARS ANTIGEN FIA: SARS Coronavirus 2 Ag: POSITIVE — AB

## 2024-08-05 MED ORDER — ONDANSETRON 4 MG PO TBDP: 4.0000 mg | ORAL_TABLET | Freq: Three times a day (TID) | ORAL | 0 refills | Status: AC | PRN

## 2024-08-05 MED ORDER — DICLOFENAC SODIUM 50 MG PO TBEC: 50.0000 mg | DELAYED_RELEASE_TABLET | Freq: Two times a day (BID) | ORAL | 1 refills | Status: AC

## 2024-08-05 NOTE — ED Triage Notes (Addendum)
 Pt c/o migraine and nausea since 6:30 pm yesterday. Pt took 2 aspirin  around 8 pm with minimal relief. Pt reports barely being able to get out of the bed this morning, had heavy eyes, and photosensitivity. Pt reports intermittent sharp pain on the (L) side of his head and some mild blurred vision. Patient reports that he is supposed to wear contacts/glasses. Denies any medications today. Patient reports that he had fevers all day. He did not take his temp, just felt warm.

## 2024-08-05 NOTE — Discharge Instructions (Addendum)
" °  1. COVID-19 (Primary) - POC Influenza A/B completed today in UC is negative for influenza - POC SARS Coronavirus Ag completed today in UC is positive for COVID. - ondansetron  (ZOFRAN -ODT) 4 MG disintegrating tablet; Take 1 tablet (4 mg total) by mouth every 8 (eight) hours as needed for nausea or vomiting.  Dispense: 20 tablet; Refill: 0 - diclofenac  (VOLTAREN ) 50 MG EC tablet; Take 1 tablet (50 mg total) by mouth 2 (two) times daily.  Dispense: 30 tablet; Refill: 1  -Continue to monitor symptoms for any change in severity if there is any escalation of current symptoms or development of new symptoms follow-up in ER for further evaluation and management. "
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
